# Patient Record
Sex: Female | Born: 1950
Health system: Southern US, Community
[De-identification: ages and names within clinical notes are randomized; demographics above are authoritative.]

## PROBLEM LIST (undated history)

## (undated) DIAGNOSIS — M549 Dorsalgia, unspecified: Secondary | ICD-10-CM

## (undated) DIAGNOSIS — H547 Unspecified visual loss: Secondary | ICD-10-CM

## (undated) DIAGNOSIS — R079 Chest pain, unspecified: Secondary | ICD-10-CM

## (undated) DIAGNOSIS — K219 Gastro-esophageal reflux disease without esophagitis: Secondary | ICD-10-CM

## (undated) DIAGNOSIS — F419 Anxiety disorder, unspecified: Secondary | ICD-10-CM

## (undated) DIAGNOSIS — R42 Dizziness and giddiness: Secondary | ICD-10-CM

## (undated) DIAGNOSIS — G4730 Sleep apnea, unspecified: Secondary | ICD-10-CM

## (undated) DIAGNOSIS — F32A Depression, unspecified: Secondary | ICD-10-CM

## (undated) DIAGNOSIS — I10 Essential (primary) hypertension: Secondary | ICD-10-CM

## (undated) DIAGNOSIS — M51369 Other intervertebral disc degeneration, lumbar region without mention of lumbar back pain or lower extremity pain: Secondary | ICD-10-CM

## (undated) DIAGNOSIS — Z87442 Personal history of urinary calculi: Secondary | ICD-10-CM

## (undated) DIAGNOSIS — F329 Major depressive disorder, single episode, unspecified: Secondary | ICD-10-CM

## (undated) DIAGNOSIS — M5136 Other intervertebral disc degeneration, lumbar region: Secondary | ICD-10-CM

## (undated) DIAGNOSIS — M5126 Other intervertebral disc displacement, lumbar region: Secondary | ICD-10-CM

## (undated) DIAGNOSIS — I8393 Asymptomatic varicose veins of bilateral lower extremities: Secondary | ICD-10-CM

## (undated) DIAGNOSIS — G43909 Migraine, unspecified, not intractable, without status migrainosus: Secondary | ICD-10-CM

## (undated) HISTORY — DX: Other intervertebral disc degeneration, lumbar region: M51.36

## (undated) HISTORY — PX: BREAST CYST ASPIRATION: SHX578

## (undated) HISTORY — PX: STENT PLACE LEFT URETER (ARMC HX): HXRAD1254

## (undated) HISTORY — DX: Chest pain, unspecified: R07.9

## (undated) HISTORY — DX: Unspecified visual loss: H54.7

## (undated) HISTORY — PX: OTHER SURGICAL HISTORY: SHX169

## (undated) HISTORY — DX: Other intervertebral disc displacement, lumbar region: M51.26

## (undated) HISTORY — DX: Migraine, unspecified, not intractable, without status migrainosus: G43.909

## (undated) HISTORY — DX: Anxiety disorder, unspecified: F41.9

## (undated) HISTORY — DX: Depression, unspecified: F32.A

## (undated) HISTORY — DX: Dorsalgia, unspecified: M54.9

## (undated) HISTORY — PX: TONSILLECTOMY: SUR1361

## (undated) HISTORY — DX: Sleep apnea, unspecified: G47.30

## (undated) HISTORY — DX: Asymptomatic varicose veins of bilateral lower extremities: I83.93

## (undated) HISTORY — DX: Dizziness and giddiness: R42

## (undated) HISTORY — DX: Major depressive disorder, single episode, unspecified: F32.9

## (undated) HISTORY — DX: Other intervertebral disc degeneration, lumbar region without mention of lumbar back pain or lower extremity pain: M51.369

---

## 2004-02-07 ENCOUNTER — Ambulatory Visit: Payer: Self-pay | Admitting: Urology

## 2005-02-22 ENCOUNTER — Ambulatory Visit: Payer: Self-pay | Admitting: Urology

## 2005-03-02 ENCOUNTER — Ambulatory Visit: Payer: Self-pay | Admitting: Internal Medicine

## 2006-04-12 ENCOUNTER — Ambulatory Visit: Payer: Self-pay | Admitting: Internal Medicine

## 2006-11-18 ENCOUNTER — Ambulatory Visit: Payer: Self-pay | Admitting: Unknown Physician Specialty

## 2007-04-17 ENCOUNTER — Ambulatory Visit: Payer: Self-pay | Admitting: Internal Medicine

## 2008-06-15 ENCOUNTER — Ambulatory Visit: Payer: Self-pay | Admitting: Internal Medicine

## 2009-04-30 ENCOUNTER — Emergency Department: Payer: Self-pay | Admitting: Emergency Medicine

## 2009-08-30 ENCOUNTER — Ambulatory Visit: Payer: Self-pay | Admitting: Internal Medicine

## 2010-09-06 ENCOUNTER — Ambulatory Visit: Payer: Self-pay | Admitting: Internal Medicine

## 2011-09-12 ENCOUNTER — Ambulatory Visit: Payer: Self-pay

## 2012-11-05 ENCOUNTER — Ambulatory Visit: Payer: Self-pay | Admitting: Internal Medicine

## 2014-03-09 ENCOUNTER — Ambulatory Visit: Payer: Self-pay

## 2014-08-25 ENCOUNTER — Other Ambulatory Visit
Admission: RE | Admit: 2014-08-25 | Discharge: 2014-08-25 | Disposition: A | Discharge status: Home / Self Care | Payer: PRIVATE HEALTH INSURANCE | Source: Ambulatory Visit | Attending: Nurse Practitioner | Admitting: Nurse Practitioner

## 2014-08-25 DIAGNOSIS — Z0000 Encounter for general adult medical examination without abnormal findings: Secondary | ICD-10-CM | POA: Insufficient documentation

## 2014-08-25 DIAGNOSIS — I10 Essential (primary) hypertension: Secondary | ICD-10-CM | POA: Insufficient documentation

## 2014-08-25 DIAGNOSIS — E780 Pure hypercholesterolemia: Secondary | ICD-10-CM | POA: Insufficient documentation

## 2014-08-25 DIAGNOSIS — E559 Vitamin D deficiency, unspecified: Secondary | ICD-10-CM | POA: Insufficient documentation

## 2014-08-25 LAB — CBC
HCT: 41.9 % (ref 35.0–47.0)
Hemoglobin: 13.9 g/dL (ref 12.0–16.0)
MCH: 29.9 pg (ref 26.0–34.0)
MCHC: 33.3 g/dL (ref 32.0–36.0)
MCV: 89.8 fL (ref 80.0–100.0)
Platelets: 198 10*3/uL (ref 150–440)
RBC: 4.66 MIL/uL (ref 3.80–5.20)
RDW: 14.3 % (ref 11.5–14.5)
WBC: 6.1 10*3/uL (ref 3.6–11.0)

## 2014-08-25 LAB — COMPREHENSIVE METABOLIC PANEL
ALT: 23 U/L (ref 14–54)
ANION GAP: 8 (ref 5–15)
AST: 35 U/L (ref 15–41)
Albumin: 4.4 g/dL (ref 3.5–5.0)
Alkaline Phosphatase: 70 U/L (ref 38–126)
BILIRUBIN TOTAL: 0.4 mg/dL (ref 0.3–1.2)
BUN: 23 mg/dL — AB (ref 6–20)
CO2: 28 mmol/L (ref 22–32)
CREATININE: 0.77 mg/dL (ref 0.44–1.00)
Calcium: 9.5 mg/dL (ref 8.9–10.3)
Chloride: 105 mmol/L (ref 101–111)
GFR calc Af Amer: >60 mL/min (ref >60)
GFR calc non Af Amer: >60 mL/min (ref >60)
Glucose, Bld: 105 mg/dL — ABNORMAL HIGH (ref 65–99)
Potassium: 4.5 mmol/L (ref 3.5–5.1)
Sodium: 141 mmol/L (ref 135–145)
Total Protein: 6.8 g/dL (ref 6.5–8.1)

## 2014-08-25 LAB — LIPID PANEL
Cholesterol: 194 mg/dL (ref 0–200)
HDL: 76 mg/dL (ref >40)
LDL CALC: 106 mg/dL — AB (ref 0–99)
Total CHOL/HDL Ratio: 2.6 RATIO
Triglycerides: 61 mg/dL (ref <150)
VLDL: 12 mg/dL (ref 0–40)

## 2014-08-25 LAB — TSH: TSH: 1.647 u[IU]/mL (ref 0.350–4.500)

## 2014-08-26 LAB — VITAMIN D 25 HYDROXY (VIT D DEFICIENCY, FRACTURES): VIT D 25 HYDROXY: 27.2 ng/mL — AB (ref 30.0–100.0)

## 2014-08-26 LAB — T3: T3, Total: 122 ng/dL (ref 71–180)

## 2014-08-26 LAB — T4: T4, Total: 7.6 ug/dL (ref 4.5–12.0)

## 2015-01-31 ENCOUNTER — Other Ambulatory Visit: Payer: Self-pay | Admitting: Nurse Practitioner

## 2015-01-31 DIAGNOSIS — Z1231 Encounter for screening mammogram for malignant neoplasm of breast: Secondary | ICD-10-CM

## 2015-03-14 ENCOUNTER — Ambulatory Visit: Payer: PRIVATE HEALTH INSURANCE | Attending: Nurse Practitioner

## 2015-10-26 DIAGNOSIS — Z800 Family history of malignant neoplasm of digestive organs: Secondary | ICD-10-CM | POA: Insufficient documentation

## 2015-10-26 DIAGNOSIS — R12 Heartburn: Secondary | ICD-10-CM | POA: Insufficient documentation

## 2016-01-02 DIAGNOSIS — S92909A Unspecified fracture of unspecified foot, initial encounter for closed fracture: Secondary | ICD-10-CM | POA: Insufficient documentation

## 2016-06-07 DIAGNOSIS — E782 Mixed hyperlipidemia: Secondary | ICD-10-CM | POA: Diagnosis not present

## 2016-06-07 DIAGNOSIS — F411 Generalized anxiety disorder: Secondary | ICD-10-CM | POA: Diagnosis not present

## 2016-06-07 DIAGNOSIS — E663 Overweight: Secondary | ICD-10-CM | POA: Diagnosis not present

## 2016-06-07 DIAGNOSIS — I10 Essential (primary) hypertension: Secondary | ICD-10-CM | POA: Diagnosis not present

## 2016-09-07 DIAGNOSIS — Z0001 Encounter for general adult medical examination with abnormal findings: Secondary | ICD-10-CM | POA: Diagnosis not present

## 2016-09-07 DIAGNOSIS — E782 Mixed hyperlipidemia: Secondary | ICD-10-CM | POA: Diagnosis not present

## 2016-09-07 DIAGNOSIS — I10 Essential (primary) hypertension: Secondary | ICD-10-CM | POA: Diagnosis not present

## 2016-09-07 DIAGNOSIS — E559 Vitamin D deficiency, unspecified: Secondary | ICD-10-CM | POA: Diagnosis not present

## 2016-11-05 DIAGNOSIS — I10 Essential (primary) hypertension: Secondary | ICD-10-CM | POA: Diagnosis not present

## 2016-11-05 DIAGNOSIS — E782 Mixed hyperlipidemia: Secondary | ICD-10-CM | POA: Diagnosis not present

## 2016-11-05 DIAGNOSIS — E663 Overweight: Secondary | ICD-10-CM | POA: Diagnosis not present

## 2016-11-05 DIAGNOSIS — F411 Generalized anxiety disorder: Secondary | ICD-10-CM | POA: Diagnosis not present

## 2017-04-05 ENCOUNTER — Encounter: Payer: Self-pay | Admitting: Nurse Practitioner

## 2017-04-05 ENCOUNTER — Ambulatory Visit: Payer: BLUE CROSS/BLUE SHIELD | Admitting: Nurse Practitioner

## 2017-04-05 VITALS — BP 152/86 | HR 80 | Resp 16 | Ht 63.0 in | Wt 169.8 lb

## 2017-04-05 DIAGNOSIS — D485 Neoplasm of uncertain behavior of skin: Secondary | ICD-10-CM | POA: Insufficient documentation

## 2017-04-05 DIAGNOSIS — R079 Chest pain, unspecified: Secondary | ICD-10-CM | POA: Insufficient documentation

## 2017-04-05 DIAGNOSIS — I341 Nonrheumatic mitral (valve) prolapse: Secondary | ICD-10-CM | POA: Insufficient documentation

## 2017-04-05 DIAGNOSIS — E663 Overweight: Secondary | ICD-10-CM

## 2017-04-05 DIAGNOSIS — I10 Essential (primary) hypertension: Secondary | ICD-10-CM | POA: Diagnosis not present

## 2017-04-05 DIAGNOSIS — I058 Other rheumatic mitral valve diseases: Secondary | ICD-10-CM

## 2017-04-05 DIAGNOSIS — J393 Upper respiratory tract hypersensitivity reaction, site unspecified: Secondary | ICD-10-CM | POA: Insufficient documentation

## 2017-04-05 DIAGNOSIS — E782 Mixed hyperlipidemia: Secondary | ICD-10-CM | POA: Diagnosis not present

## 2017-04-05 DIAGNOSIS — F411 Generalized anxiety disorder: Secondary | ICD-10-CM | POA: Diagnosis not present

## 2017-04-05 DIAGNOSIS — K219 Gastro-esophageal reflux disease without esophagitis: Secondary | ICD-10-CM | POA: Insufficient documentation

## 2017-04-05 DIAGNOSIS — R0602 Shortness of breath: Secondary | ICD-10-CM | POA: Insufficient documentation

## 2017-04-05 DIAGNOSIS — G4700 Insomnia, unspecified: Secondary | ICD-10-CM | POA: Insufficient documentation

## 2017-04-05 DIAGNOSIS — E669 Obesity, unspecified: Secondary | ICD-10-CM | POA: Insufficient documentation

## 2017-04-05 DIAGNOSIS — R300 Dysuria: Secondary | ICD-10-CM | POA: Insufficient documentation

## 2017-04-05 DIAGNOSIS — I059 Rheumatic mitral valve disease, unspecified: Secondary | ICD-10-CM | POA: Insufficient documentation

## 2017-04-05 MED ORDER — ALPRAZOLAM 0.25 MG PO TABS: 0.2500 mg | ORAL_TABLET | ORAL | 3 refills | Status: DC | PRN

## 2017-04-05 NOTE — Patient Instructions (Addendum)

## 2017-04-05 NOTE — Progress Notes (Signed)
Subjective:     Patient ID: Patricia Frazier, female   DOB: [birth year], 66 y.o.   MRN: [id number]  The patient is here for routine follow up visit. She is c/o increased stress related to work and home-life. Sometiimes has trouble sleeping due to increased stress. Blood pressure is doing well. Does feel like she has gained some weight over past few months. Would like to restart appetite suppressant to get a jump start on weight loss in the new year.     Current Outpatient Medications:  .  ALPRAZolam (XANAX) 0.25 MG tablet, Take 1 tablet (0.25 mg total) by mouth as needed for anxiety., Disp: 30 tablet, Rfl: 3 .  amLODipine (NORVASC) 10 MG tablet, Take 10 mg by mouth daily., Disp: , Rfl:  .  phentermine 37.5 MG capsule, Take 37.5 mg by mouth every morning., Disp: , Rfl:  .  pravastatin (PRAVACHOL) 40 MG tablet, Take 40 mg by mouth at bedtime., Disp: , Rfl:   Review of Systems  Constitutional: Positive for fatigue and unexpected weight change.  HENT: Negative.   Respiratory: Negative.  Negative for chest tightness and shortness of breath.   Cardiovascular: Negative for chest pain and palpitations.  Gastrointestinal: Negative.   Endocrine: Negative.   Genitourinary: Negative.   Musculoskeletal: Negative.   Skin: Negative.   Allergic/Immunologic: Negative.   Neurological: Negative.   Hematological: Negative.   Psychiatric/Behavioral: Positive for sleep disturbance. The patient is nervous/anxious.    Today's Vitals   04/05/17 1557 04/05/17 1654  BP: (!) 162/87 (!) 152/86  Pulse: (!) 56 80  Resp: 16   SpO2: 98%   Weight: 169 lb 12.8 oz (77 kg)   Height: 5\' 3"  (1.6 m)       Objective:   Physical Exam  Constitutional: She is oriented to person, place, and time. She appears well-developed and well-nourished.  HENT:  Head: Normocephalic.  Eyes: Pupils are equal, round, and reactive to light.  Neck: Normal range of motion. Neck supple. No JVD present. Carotid bruit is not present. No  thyromegaly present.  Cardiovascular: Normal rate, regular rhythm, normal heart sounds and intact distal pulses.  Pulmonary/Chest: Effort normal and breath sounds normal.  Abdominal: Soft. Bowel sounds are normal. There is no tenderness.  Musculoskeletal: Normal range of motion.  Neurological: She is alert and oriented to person, place, and time.  Skin: Skin is warm and dry.  Psychiatric: She has a normal mood and affect.  Nursing note and vitals reviewed.      Assessment:     Essential (primary) hypertension  Mixed hyperlipidemia  Overweight  GAD (generalized anxiety disorder) - Plan: ALPRAZolam (XANAX) 0.25 MG tablet     Plan:     1. bp stable. Continue bp meds as prescribed  2. Fasting lipid panel ordered. Adjust pravastatin at next visit as indicated 3. Add back phentermine 37.5mg  tablets for next month, possibly 2. Limit calorie intake to 1200 calories per day and participate in regular exercise program.  4. May continue alprazolam 0.25mg  daily as needed new rx provided today.   Follow up 4 weeks.

## 2017-05-24 ENCOUNTER — Encounter: Payer: Self-pay | Admitting: Nurse Practitioner

## 2017-05-24 ENCOUNTER — Ambulatory Visit: Payer: BLUE CROSS/BLUE SHIELD | Admitting: Nurse Practitioner

## 2017-05-24 VITALS — BP 125/84 | HR 67 | Resp 16 | Ht 63.0 in | Wt 168.4 lb

## 2017-05-24 DIAGNOSIS — K219 Gastro-esophageal reflux disease without esophagitis: Secondary | ICD-10-CM

## 2017-05-24 DIAGNOSIS — E663 Overweight: Secondary | ICD-10-CM | POA: Diagnosis not present

## 2017-05-24 DIAGNOSIS — I10 Essential (primary) hypertension: Secondary | ICD-10-CM

## 2017-05-24 DIAGNOSIS — E782 Mixed hyperlipidemia: Secondary | ICD-10-CM

## 2017-05-24 MED ORDER — PRAVASTATIN SODIUM 40 MG PO TABS: 40.0000 mg | ORAL_TABLET | Freq: Every day | ORAL | 5 refills | Status: DC

## 2017-05-24 MED ORDER — PHENTERMINE HCL 37.5 MG PO TABS: 37.5000 mg | ORAL_TABLET | Freq: Every day | ORAL | 1 refills | Status: DC

## 2017-05-24 MED ORDER — OMEPRAZOLE MAGNESIUM 20 MG PO TBEC: 20.0000 mg | DELAYED_RELEASE_TABLET | Freq: Every day | ORAL | 5 refills | Status: DC

## 2017-05-24 MED ORDER — AMLODIPINE BESYLATE 10 MG PO TABS: 10.0000 mg | ORAL_TABLET | Freq: Every day | ORAL | 5 refills | Status: DC

## 2017-05-24 NOTE — Progress Notes (Signed)
[facility] [place], [place] [address]  Internal MEDICINE  Office Visit Note  Patient Name: Patricia Frazier  [id number]  [id number]  Date of Service: 06/02/2017  Chief Complaint  Patient presents with  . Medication Refill    1 pound weight loss. currently takig phentermine. bp has improved with mild weight loss.    The patient is here for routine follow up. Was recently restarted on phentermine to help with weight loss. 1 pound weight loss. currently takig phentermine. bp has improved with mild weight loss. Has been taking weight loss medication off and on and has been off of it for a few weeks. She does need to have refills of her routine medications.    Medication Refill  Pertinent negatives include no abdominal pain, arthralgias, chest pain, chills, congestion, coughing, fatigue, joint swelling, nausea, neck pain, numbness, rash, sore throat or vomiting.    Pt is here for routine follow up.    Current Medication: Outpatient Encounter Medications as of 05/24/2017  Medication Sig  . ALPRAZolam (XANAX) 0.25 MG tablet Take 1 tablet (0.25 mg total) by mouth as needed for anxiety.  Marland Kitchen amLODipine (NORVASC) 10 MG tablet Take 1 tablet (10 mg total) by mouth daily.  Marland Kitchen omeprazole (PRILOSEC OTC) 20 MG tablet Take 1 tablet (20 mg total) by mouth daily.  . pravastatin (PRAVACHOL) 40 MG tablet Take 1 tablet (40 mg total) by mouth at bedtime.  . [DISCONTINUED] amLODipine (NORVASC) 10 MG tablet Take 10 mg by mouth daily.  . [DISCONTINUED] omeprazole (PRILOSEC OTC) 20 MG tablet Take by mouth.  . [DISCONTINUED] Phendimetrazine Tartrate 105 MG CP24 phendimetrazine tartrate ER 105 mg capsule,extended release  TAKE ONE CAPSULE BY MOUTH DAILY FOR WEIGHT LOSS  . [DISCONTINUED] phentermine 37.5 MG capsule Take 37.5 mg by mouth every morning.  . [DISCONTINUED] pravastatin (PRAVACHOL) 40 MG tablet Take 40 mg by mouth at bedtime.  . ondansetron (ZOFRAN-ODT) 4 MG disintegrating  tablet Take by mouth.  . phentermine (ADIPEX-P) 37.5 MG tablet Take 1 tablet (37.5 mg total) by mouth daily before breakfast.  . [DISCONTINUED] celecoxib (CELEBREX) 200 MG capsule Take by mouth.   No facility-administered encounter medications on file as of 05/24/2017.     Surgical History: Past Surgical History:  Procedure Laterality Date  . TONSILLECTOMY    . tubiligation      Medical History: Past Medical History:  Diagnosis Date  . Anxiety   . Back pain   . Chest pain   . Depression   . Migraine   . Sleep apnea   . Varicose veins of legs   . Vision problems     Family History: Family History  Problem Relation Age of Onset  . Hypertension Mother   . Cancer Father   . Hypertension Father   . Hyperlipidemia Father     Social History   Socioeconomic History  . Marital status: Married    Spouse name: Not on file  . Number of children: Not on file  . Years of education: Not on file  . Highest education level: Not on file  Social Needs  . Financial resource strain: Not on file  . Food insecurity - worry: Not on file  . Food insecurity - inability: Not on file  . Transportation needs - medical: Not on file  . Transportation needs - non-medical: Not on file  Occupational History  . Not on file  Tobacco Use  . Smoking status: Former Smoker    Types: Cigarettes  .  Smokeless tobacco: Never Used  Substance and Sexual Activity  . Alcohol use: Yes  . Drug use: No  . Sexual activity: No  Other Topics Concern  . Not on file  Social History Narrative  . Not on file      Review of Systems  Constitutional: Positive for unexpected weight change. Negative for chills and fatigue.       Weight loss 7 pounds since most recent visit.   HENT: Positive for postnasal drip. Negative for congestion, rhinorrhea, sneezing and sore throat.   Eyes: Negative for redness.  Respiratory: Negative for cough, chest tightness and shortness of breath.   Cardiovascular: Negative for  chest pain and palpitations.  Gastrointestinal: Negative for abdominal pain, constipation, diarrhea, nausea and vomiting.  Genitourinary: Negative for dysuria and frequency.  Musculoskeletal: Negative for arthralgias, back pain, joint swelling and neck pain.  Skin: Negative for rash.  Neurological: Negative.  Negative for tremors and numbness.  Hematological: Negative for adenopathy. Does not bruise/bleed easily.  Psychiatric/Behavioral: Negative for behavioral problems (Depression), sleep disturbance and suicidal ideas. The patient is not nervous/anxious.     Today's Vitals   05/24/17 1633  BP: 125/84  Pulse: 67  Resp: 16  SpO2: 95%  Weight: 168 lb 6.4 oz (76.4 kg)  Height: 5\' 3"  (1.6 m)    Physical Exam  Constitutional: She is oriented to person, place, and time. She appears well-developed and well-nourished. No distress.  HENT:  Head: Normocephalic and atraumatic.  Mouth/Throat: No oropharyngeal exudate.  Eyes: EOM are normal. Pupils are equal, round, and reactive to light.  Neck: Normal range of motion. Neck supple. No JVD present. Carotid bruit is not present. No tracheal deviation present. No thyromegaly present.  Cardiovascular: Normal rate, regular rhythm and normal heart sounds. Exam reveals no gallop and no friction rub.  No murmur heard. Pulmonary/Chest: Effort normal and breath sounds normal. No respiratory distress. She has no wheezes. She has no rales. She exhibits no tenderness.  Abdominal: Soft. Bowel sounds are normal. There is no tenderness.  Musculoskeletal: Normal range of motion.  Lymphadenopathy:    She has no cervical adenopathy.  Neurological: She is alert and oriented to person, place, and time. No cranial nerve deficit.  Skin: Skin is warm and dry. She is not diaphoretic.  Psychiatric: She has a normal mood and affect. Her behavior is normal. Judgment and thought content normal.  Nursing note and vitals reviewed.  Assessment/Plan: 1. Essential  (primary) hypertension Stable. Continue bp medicatin as prescribed.  - amLODipine (NORVASC) 10 MG tablet; Take 1 tablet (10 mg total) by mouth daily.  Dispense: 30 tablet; Refill: 5  2. Overweight Doing well. Continue phentermine. Limit calrie intake to 1200 calories per day. Participate in routine exercise.  - phentermine (ADIPEX-P) 37.5 MG tablet; Take 1 tablet (37.5 mg total) by mouth daily before breakfast.  Dispense: 30 tablet; Refill: 1  3. Mixed hyperlipidemia - pravastatin (PRAVACHOL) 40 MG tablet; Take 1 tablet (40 mg total) by mouth at bedtime.  Dispense: 30 tablet; Refill: 5  4. Gastro-esophageal reflux disease without esophagitis - omeprazole (PRILOSEC OTC) 20 MG tablet; Take 1 tablet (20 mg total) by mouth daily.  Dispense: 30 tablet; Refill: 5  General Counseling: Tania verbalizes understanding of the findings of todays visit and agrees with plan of treatment. I have discussed any further diagnostic evaluation that may be needed or ordered today. We also reviewed her medications today. she has been encouraged to call the office with any questions or concerns  that should arise related to todays visit.   This patient was seen by Leretha Pol, FNP- C in Collaboration with Dr Lavera Guise as a part of collaborative care agreement    Meds ordered this encounter  Medications  . phentermine (ADIPEX-P) 37.5 MG tablet    Sig: Take 1 tablet (37.5 mg total) by mouth daily before breakfast.    Dispense:  30 tablet    Refill:  1    Order Specific Question:   Supervising Provider    Answer:   Lavera Guise [place]  . amLODipine (NORVASC) 10 MG tablet    Sig: Take 1 tablet (10 mg total) by mouth daily.    Dispense:  30 tablet    Refill:  5    Order Specific Question:   Supervising Provider    Answer:   Lavera Guise [[id number]]  . omeprazole (PRILOSEC OTC) 20 MG tablet    Sig: Take 1 tablet (20 mg total) by mouth daily.    Dispense:  30 tablet    Refill:  5    Order Specific  Question:   Supervising Provider    Answer:   Lavera Guise [[id number]]  . pravastatin (PRAVACHOL) 40 MG tablet    Sig: Take 1 tablet (40 mg total) by mouth at bedtime.    Dispense:  30 tablet    Refill:  5    Order Specific Question:   Supervising Provider    Answer:   Lavera Guise [[id number]]    Time spent: 46 Minutes          Dr Lavera Guise Internal medicine

## 2017-06-02 ENCOUNTER — Encounter: Payer: Self-pay | Admitting: Nurse Practitioner

## 2017-06-28 ENCOUNTER — Encounter: Payer: Self-pay | Admitting: Nurse Practitioner

## 2017-06-28 ENCOUNTER — Ambulatory Visit: Payer: BLUE CROSS/BLUE SHIELD | Admitting: Nurse Practitioner

## 2017-06-28 VITALS — BP 140/80 | HR 68 | Resp 16 | Ht 63.0 in | Wt 169.0 lb

## 2017-06-28 DIAGNOSIS — F411 Generalized anxiety disorder: Secondary | ICD-10-CM | POA: Diagnosis not present

## 2017-06-28 DIAGNOSIS — I10 Essential (primary) hypertension: Secondary | ICD-10-CM

## 2017-06-28 DIAGNOSIS — E663 Overweight: Secondary | ICD-10-CM | POA: Diagnosis not present

## 2017-06-28 DIAGNOSIS — K219 Gastro-esophageal reflux disease without esophagitis: Secondary | ICD-10-CM | POA: Diagnosis not present

## 2017-06-28 MED ORDER — OMEPRAZOLE MAGNESIUM 20 MG PO TBEC: 20.0000 mg | DELAYED_RELEASE_TABLET | Freq: Every day | ORAL | 4 refills | Status: DC

## 2017-06-28 MED ORDER — PHENTERMINE HCL 37.5 MG PO TABS: 37.5000 mg | ORAL_TABLET | Freq: Every day | ORAL | 1 refills | Status: DC

## 2017-06-28 NOTE — Progress Notes (Signed)
[facility] [place], [place] [address]  Internal MEDICINE  Office Visit Note  Patient Name: Patricia Frazier  [id number]  [id number]  Date of Service: 07/21/2017  Chief Complaint  Patient presents with  . Hypertension    The patient is here for routine follow up. Currently on phentermine to help with weight loss. Has reduced her calorie intake and is exercising regularly, now. Did not lose weight, however, clothing are fitting looser and she is feeling better with more energy.    Pt is here for routine follow up.    Current Medication: Outpatient Encounter Medications as of 06/28/2017  Medication Sig  . ALPRAZolam (XANAX) 0.25 MG tablet Take 1 tablet (0.25 mg total) by mouth as needed for anxiety.  Marland Kitchen amLODipine (NORVASC) 10 MG tablet Take 1 tablet (10 mg total) by mouth daily.  Marland Kitchen omeprazole (PRILOSEC OTC) 20 MG tablet Take 1 tablet (20 mg total) by mouth daily.  . ondansetron (ZOFRAN-ODT) 4 MG disintegrating tablet Take by mouth.  . phentermine (ADIPEX-P) 37.5 MG tablet Take 1 tablet (37.5 mg total) by mouth daily before breakfast.  . pravastatin (PRAVACHOL) 40 MG tablet Take 1 tablet (40 mg total) by mouth at bedtime.  . [DISCONTINUED] omeprazole (PRILOSEC OTC) 20 MG tablet Take 1 tablet (20 mg total) by mouth daily.  . [DISCONTINUED] phentermine (ADIPEX-P) 37.5 MG tablet Take 1 tablet (37.5 mg total) by mouth daily before breakfast.   No facility-administered encounter medications on file as of 06/28/2017.     Surgical History: Past Surgical History:  Procedure Laterality Date  . TONSILLECTOMY    . tubiligation      Medical History: Past Medical History:  Diagnosis Date  . Anxiety   . Back pain   . Chest pain   . Depression   . Migraine   . Sleep apnea   . Varicose veins of legs   . Vision problems     Family History: Family History  Problem Relation Age of Onset  . Hypertension Mother   . Cancer Father   . Hypertension Father    . Hyperlipidemia Father     Social History   Socioeconomic History  . Marital status: Married    Spouse name: Not on file  . Number of children: Not on file  . Years of education: Not on file  . Highest education level: Not on file  Occupational History  . Not on file  Social Needs  . Financial resource strain: Not on file  . Food insecurity:    Worry: Not on file    Inability: Not on file  . Transportation needs:    Medical: Not on file    Non-medical: Not on file  Tobacco Use  . Smoking status: Former Smoker    Types: Cigarettes  . Smokeless tobacco: Never Used  Substance and Sexual Activity  . Alcohol use: Yes  . Drug use: No  . Sexual activity: Never  Lifestyle  . Physical activity:    Days per week: Not on file    Minutes per session: Not on file  . Stress: Not on file  Relationships  . Social connections:    Talks on phone: Not on file    Gets together: Not on file    Attends religious service: Not on file    Active member of club or organization: Not on file    Attends meetings of clubs or organizations: Not on file    Relationship status: Not on file  .  Intimate partner violence:    Fear of current or ex partner: Not on file    Emotionally abused: Not on file    Physically abused: Not on file    Forced sexual activity: Not on file  Other Topics Concern  . Not on file  Social History Narrative  . Not on file      Review of Systems  Constitutional: Negative for chills, fatigue and unexpected weight change.       No weight loss or weight gain since her last visit.   HENT: Negative for congestion, postnasal drip, rhinorrhea, sneezing and sore throat.   Eyes: Negative.  Negative for redness.  Respiratory: Negative for cough, chest tightness, shortness of breath and wheezing.   Cardiovascular: Negative for chest pain and palpitations.  Gastrointestinal: Negative for abdominal pain, constipation, diarrhea, nausea and vomiting.  Endocrine: Negative for  cold intolerance, heat intolerance, polydipsia and polyuria.  Genitourinary: Negative for dysuria and frequency.  Musculoskeletal: Negative for arthralgias, back pain, joint swelling and neck pain.  Skin: Negative for rash.  Allergic/Immunologic: Negative for environmental allergies.  Neurological: Negative for tremors, numbness and headaches.  Hematological: Negative for adenopathy. Does not bruise/bleed easily.  Psychiatric/Behavioral: Negative for behavioral problems (Depression), sleep disturbance and suicidal ideas. The patient is nervous/anxious.     Vital Signs: BP 140/80   Pulse 68   Resp 16   Ht 5\' 3"  (1.6 m)   Wt 169 lb (76.7 kg)   SpO2 98%   BMI 29.94 kg/m    Physical Exam  Constitutional: She is oriented to person, place, and time. She appears well-developed and well-nourished. No distress.  HENT:  Head: Normocephalic and atraumatic.  Mouth/Throat: No oropharyngeal exudate.  Eyes: Pupils are equal, round, and reactive to light. EOM are normal.  Neck: Normal range of motion. Neck supple. No JVD present. Carotid bruit is not present. No tracheal deviation present. No thyromegaly present.  Cardiovascular: Normal rate, regular rhythm and normal heart sounds. Exam reveals no gallop and no friction rub.  No murmur heard. Pulmonary/Chest: Effort normal and breath sounds normal. No respiratory distress. She has no wheezes. She has no rales. She exhibits no tenderness.  Abdominal: Soft. Bowel sounds are normal. There is no tenderness.  Musculoskeletal: Normal range of motion.  Lymphadenopathy:    She has no cervical adenopathy.  Neurological: She is alert and oriented to person, place, and time. No cranial nerve deficit.  Skin: Skin is warm and dry. She is not diaphoretic.  Psychiatric: She has a normal mood and affect. Her behavior is normal. Judgment and thought content normal.  Nursing note and vitals reviewed.  Assessment/Plan: 1. Essential (primary)  hypertension Stable. Continue bp medication as prescribed.   2. Gastro-esophageal reflux disease without esophagitis - omeprazole (PRILOSEC OTC) 20 MG tablet; Take 1 tablet (20 mg total) by mouth daily.  Dispense: 90 tablet; Refill: 4  3. Overweight Continue with limiting calorie intake to 1200-1500 calories per day. Continue regular exercise.  - phentermine (ADIPEX-P) 37.5 MG tablet; Take 1 tablet (37.5 mg total) by mouth daily before breakfast.  Dispense: 30 tablet; Refill: 1  4. Generalized anxiety disorder May continue to take alprazolam as needed and as prescribed.   General Counseling: fe okubo understanding of the findings of todays visit and agrees with plan of treatment. I have discussed any further diagnostic evaluation that may be needed or ordered today. We also reviewed her medications today. she has been encouraged to call the office with any questions or  concerns that should arise related to todays visit.    There is a liability release in patients' chart. There has been a 10 minute discussion about the side effects including but not limited to elevated blood pressure, anxiety, lack of sleep and dry mouth. Pt understands and will like to start/continue on appetite suppressant at this time. There will be one month RX given at the time of visit with proper follow up. Nova diet plan with restricted calories is given to the pt. Pt understands and agrees with  plan of treatment  This patient was seen by Leretha Pol, FNP- C in Collaboration with Dr Lavera Guise as a part of collaborative care agreement  Meds ordered this encounter  Medications  . phentermine (ADIPEX-P) 37.5 MG tablet    Sig: Take 1 tablet (37.5 mg total) by mouth daily before breakfast.    Dispense:  30 tablet    Refill:  1    Order Specific Question:   Supervising Provider    Answer:   Lavera Guise [place]  . omeprazole (PRILOSEC OTC) 20 MG tablet    Sig: Take 1 tablet (20 mg total) by mouth  daily.    Dispense:  90 tablet    Refill:  4    Please change to 90 day prescription. [email or web address]    Order Specific Question:   Supervising Provider    Answer:   Lavera Guise [[id number]]    Time spent: 38 Minutes     Dr Lavera Guise Internal medicine

## 2017-08-05 ENCOUNTER — Ambulatory Visit: Payer: Self-pay | Admitting: Nurse Practitioner

## 2017-08-09 ENCOUNTER — Ambulatory Visit: Payer: Self-pay | Admitting: Nurse Practitioner

## 2017-08-23 DIAGNOSIS — M5442 Lumbago with sciatica, left side: Secondary | ICD-10-CM | POA: Diagnosis not present

## 2017-08-23 DIAGNOSIS — M47896 Other spondylosis, lumbar region: Secondary | ICD-10-CM | POA: Diagnosis not present

## 2017-09-06 ENCOUNTER — Encounter: Payer: Self-pay | Admitting: Nurse Practitioner

## 2017-09-06 ENCOUNTER — Ambulatory Visit: Payer: BLUE CROSS/BLUE SHIELD | Admitting: Nurse Practitioner

## 2017-09-06 VITALS — BP 130/80 | HR 80 | Resp 16 | Ht 63.5 in | Wt 170.0 lb

## 2017-09-06 DIAGNOSIS — E663 Overweight: Secondary | ICD-10-CM | POA: Diagnosis not present

## 2017-09-06 DIAGNOSIS — E782 Mixed hyperlipidemia: Secondary | ICD-10-CM | POA: Diagnosis not present

## 2017-09-06 DIAGNOSIS — I10 Essential (primary) hypertension: Secondary | ICD-10-CM

## 2017-09-06 MED ORDER — PHENTERMINE HCL 37.5 MG PO TABS: 37.5000 mg | ORAL_TABLET | Freq: Every day | ORAL | 1 refills | Status: DC

## 2017-09-06 NOTE — Progress Notes (Signed)
[facility] [place],  [id number]  Internal MEDICINE  Office Visit Note  Patient Name: Patricia Frazier  [id number]  [id number]  Date of Service: 09/25/2017   Pt is here for routine follow up.    Chief Complaint  Patient presents with  . Weight Loss    follow up  . Hypertension    The patient has had 1 pound weight gain since her last visit. Has been off appetite suppressant for a few months. Has been on vacation and has long hours at work. Recently hurt her back. Believed to be slipped disc. Has had to change exercise routine. Is stretching and using her son's pool so she can exercise.      Current Medication: Outpatient Encounter Medications as of 09/06/2017  Medication Sig  . ALPRAZolam (XANAX) 0.25 MG tablet Take 1 tablet (0.25 mg total) by mouth as needed for anxiety.  Marland Kitchen amLODipine (NORVASC) 10 MG tablet Take 1 tablet (10 mg total) by mouth daily.  Marland Kitchen omeprazole (PRILOSEC OTC) 20 MG tablet Take 1 tablet (20 mg total) by mouth daily.  . ondansetron (ZOFRAN-ODT) 4 MG disintegrating tablet Take by mouth.  . phentermine (ADIPEX-P) 37.5 MG tablet Take 1 tablet (37.5 mg total) by mouth daily before breakfast.  . pravastatin (PRAVACHOL) 40 MG tablet Take 1 tablet (40 mg total) by mouth at bedtime.  . [DISCONTINUED] phentermine (ADIPEX-P) 37.5 MG tablet Take 1 tablet (37.5 mg total) by mouth daily before breakfast.   No facility-administered encounter medications on file as of 09/06/2017.     Surgical History: Past Surgical History:  Procedure Laterality Date  . TONSILLECTOMY    . tubiligation      Medical History: Past Medical History:  Diagnosis Date  . Anxiety   . Back pain   . Chest pain   . Depression   . Migraine   . Sleep apnea   . Varicose veins of legs   . Vision problems     Family History: Family History  Problem Relation Age of Onset  . Hypertension Mother   . Cancer Father   . Hypertension Father   .  Hyperlipidemia Father     Social History   Socioeconomic History  . Marital status: Married    Spouse name: Not on file  . Number of children: Not on file  . Years of education: Not on file  . Highest education level: Not on file  Occupational History  . Not on file  Social Needs  . Financial resource strain: Not on file  . Food insecurity:    Worry: Not on file    Inability: Not on file  . Transportation needs:    Medical: Not on file    Non-medical: Not on file  Tobacco Use  . Smoking status: Former Smoker    Types: Cigarettes  . Smokeless tobacco: Never Used  Substance and Sexual Activity  . Alcohol use: Yes  . Drug use: No  . Sexual activity: Never  Lifestyle  . Physical activity:    Days per week: Not on file    Minutes per session: Not on file  . Stress: Not on file  Relationships  . Social connections:    Talks on phone: Not on file    Gets together: Not on file    Attends religious service: Not on file    Active member of club or organization: Not on file    Attends meetings of clubs or organizations: Not on file  Relationship status: Not on file  . Intimate partner violence:    Fear of current or ex partner: Not on file    Emotionally abused: Not on file    Physically abused: Not on file    Forced sexual activity: Not on file  Other Topics Concern  . Not on file  Social History Narrative  . Not on file      Review of Systems  Constitutional: Negative for chills, fatigue and unexpected weight change.       No weight loss or weight gain since her last visit.   HENT: Negative for congestion, postnasal drip, rhinorrhea, sneezing and sore throat.   Eyes: Negative.  Negative for redness.  Respiratory: Negative for cough, chest tightness, shortness of breath and wheezing.   Cardiovascular: Negative for chest pain and palpitations.  Gastrointestinal: Negative for abdominal pain, constipation, diarrhea, nausea and vomiting.  Endocrine: Negative for  cold intolerance, heat intolerance, polydipsia and polyuria.  Genitourinary: Negative for dysuria and frequency.  Musculoskeletal: Negative for arthralgias, back pain, joint swelling and neck pain.  Skin: Negative for rash.  Allergic/Immunologic: Negative for environmental allergies.  Neurological: Negative for tremors, numbness and headaches.  Hematological: Negative for adenopathy. Does not bruise/bleed easily.  Psychiatric/Behavioral: Negative for behavioral problems (Depression), sleep disturbance and suicidal ideas. The patient is nervous/anxious.     Today's Vitals   09/06/17 1519  BP: 130/80  Pulse: 80  Resp: 16  SpO2: 94%  Weight: 170 lb (77.1 kg)  Height: 5' 3.5" (1.613 m)    Physical Exam  Constitutional: She is oriented to person, place, and time. She appears well-developed and well-nourished. No distress.  HENT:  Head: Normocephalic and atraumatic.  Mouth/Throat: No oropharyngeal exudate.  Eyes: Pupils are equal, round, and reactive to light. EOM are normal.  Neck: Normal range of motion. Neck supple. No JVD present. Carotid bruit is not present. No tracheal deviation present. No thyromegaly present.  Cardiovascular: Normal rate, regular rhythm and normal heart sounds. Exam reveals no gallop and no friction rub.  No murmur heard. Pulmonary/Chest: Effort normal and breath sounds normal. No respiratory distress. She has no wheezes. She has no rales. She exhibits no tenderness.  Abdominal: Soft. Bowel sounds are normal. There is no tenderness.  Musculoskeletal: Normal range of motion.  Lymphadenopathy:    She has no cervical adenopathy.  Neurological: She is alert and oriented to person, place, and time. No cranial nerve deficit.  Skin: Skin is warm and dry. She is not diaphoretic.  Psychiatric: She has a normal mood and affect. Her behavior is normal. Judgment and thought content normal.  Nursing note and vitals reviewed.  Assessment/Plan:  1. Essential (primary)  hypertension Stable. Continue bp medication as prescribed.   2. Mixed hyperlipidemia Pravastatin as prescribed.   3. Overweight - phentermine (ADIPEX-P) 37.5 MG tablet; Take 1 tablet (37.5 mg total) by mouth daily before breakfast.  Dispense: 30 tablet; Refill: 1   General Counseling: Natally verbalizes understanding of the findings of todays visit and agrees with plan of treatment. I have discussed any further diagnostic evaluation that may be needed or ordered today. We also reviewed her medications today. she has been encouraged to call the office with any questions or concerns that should arise related to todays visit.    Counseling:   There is a liability release in patients' chart. There has been a 10 minute discussion about the side effects including but not limited to elevated blood pressure, anxiety, lack of sleep and dry  mouth. Pt understands and will like to start/continue on appetite suppressant at this time. There will be one month RX given at the time of visit with proper follow up. Nova diet plan with restricted calories is given to the pt. Pt understands and agrees with  plan of treatment  This patient was seen by Leretha Pol, FNP- C in Collaboration with Dr Lavera Guise as a part of collaborative care agreement  Meds ordered this encounter  Medications  . phentermine (ADIPEX-P) 37.5 MG tablet    Sig: Take 1 tablet (37.5 mg total) by mouth daily before breakfast.    Dispense:  30 tablet    Refill:  1    Order Specific Question:   Supervising Provider    Answer:   Lavera Guise [[id number]]    Time spent: 59 Minutes          Dr Lavera Guise Internal medicine

## 2017-09-16 ENCOUNTER — Telehealth: Payer: Self-pay | Admitting: Nurse Practitioner

## 2017-09-16 ENCOUNTER — Other Ambulatory Visit: Payer: Self-pay | Admitting: Nurse Practitioner

## 2017-09-16 DIAGNOSIS — R42 Dizziness and giddiness: Secondary | ICD-10-CM

## 2017-09-16 DIAGNOSIS — R112 Nausea with vomiting, unspecified: Secondary | ICD-10-CM

## 2017-09-16 MED ORDER — MECLIZINE HCL 25 MG PO TABS: 25.0000 mg | ORAL_TABLET | Freq: Three times a day (TID) | ORAL | 1 refills | Status: DC | PRN

## 2017-09-16 NOTE — Progress Notes (Signed)
Sent in prescription for meclizine 25mg  which may be taken up to 3 times daily as needed for vertigo/nausea/vomiting.

## 2017-09-16 NOTE — Telephone Encounter (Signed)
Sent in prescription for meclizine 25mg  which may be taken up to 3 times daily as needed for vertigo/nausea/vomiting.

## 2017-09-16 NOTE — Telephone Encounter (Signed)
Informed pt that prescription was sent  °

## 2017-09-18 ENCOUNTER — Ambulatory Visit (INDEPENDENT_AMBULATORY_CARE_PROVIDER_SITE_OTHER): Payer: Self-pay | Admitting: Internal Medicine

## 2017-09-18 ENCOUNTER — Encounter: Payer: Self-pay | Admitting: Internal Medicine

## 2017-09-18 VITALS — BP 160/88 | HR 87 | Temp 97.6°F | Resp 16 | Ht 63.0 in | Wt 166.0 lb

## 2017-09-18 DIAGNOSIS — M542 Cervicalgia: Secondary | ICD-10-CM | POA: Diagnosis not present

## 2017-09-18 DIAGNOSIS — R112 Nausea with vomiting, unspecified: Secondary | ICD-10-CM | POA: Diagnosis not present

## 2017-09-18 DIAGNOSIS — M5416 Radiculopathy, lumbar region: Secondary | ICD-10-CM | POA: Diagnosis not present

## 2017-09-18 DIAGNOSIS — R42 Dizziness and giddiness: Secondary | ICD-10-CM

## 2017-09-18 DIAGNOSIS — I10 Essential (primary) hypertension: Secondary | ICD-10-CM

## 2017-09-18 MED ORDER — ONDANSETRON HCL 4 MG PO TABS: 4.0000 mg | ORAL_TABLET | Freq: Three times a day (TID) | ORAL | 0 refills | Status: DC | PRN

## 2017-09-18 NOTE — Progress Notes (Signed)
[facility] [place], [place] [address]  Internal MEDICINE  Office Visit Note  Patient Name: Patricia Frazier  [id number]  [id number]  Date of Service: 09/23/2017  Chief Complaint  Patient presents with  . Dizziness    meds not helping, vomiting  . Sciatica    left leg, appt later today   Pt is here for sick visit, She has been having problem with dizziness and vomiting, she does take ant-ivert. Pt is scheduled to have MRI of L- spine for back pian/sciatica  Dizziness  This is a recurrent problem. The current episode started in the past 7 days. The problem occurs constantly. The problem has been gradually worsening. Associated symptoms include numbness and vomiting. Pertinent negatives include no abdominal pain, arthralgias, chest pain, chills, coughing, diaphoresis, fatigue, headaches, nausea or neck pain. The symptoms are aggravated by bending. She has tried nothing for the symptoms. The treatment provided no relief.   Bp is elevated today, She thinks its elevated due to pain   Current Medication: Outpatient Encounter Medications as of 09/18/2017  Medication Sig  . ALPRAZolam (XANAX) 0.25 MG tablet Take 1 tablet (0.25 mg total) by mouth as needed for anxiety.  Marland Kitchen amLODipine (NORVASC) 10 MG tablet Take 1 tablet (10 mg total) by mouth daily.  . meclizine (ANTIVERT) 25 MG tablet Take 1 tablet (25 mg total) by mouth 3 (three) times daily as needed for dizziness.  Marland Kitchen omeprazole (PRILOSEC OTC) 20 MG tablet Take 1 tablet (20 mg total) by mouth daily.  . ondansetron (ZOFRAN) 4 MG tablet Take 1 tablet (4 mg total) by mouth every 8 (eight) hours as needed for nausea or vomiting.  . ondansetron (ZOFRAN-ODT) 4 MG disintegrating tablet Take by mouth.  . phentermine (ADIPEX-P) 37.5 MG tablet Take 1 tablet (37.5 mg total) by mouth daily before breakfast.  . pravastatin (PRAVACHOL) 40 MG tablet Take 1 tablet (40 mg total) by mouth at bedtime.   No facility-administered  encounter medications on file as of 09/18/2017.     Surgical History: Past Surgical History:  Procedure Laterality Date  . TONSILLECTOMY    . tubiligation      Medical History: Past Medical History:  Diagnosis Date  . Anxiety   . Back pain   . Chest pain   . Depression   . Migraine   . Sleep apnea   . Varicose veins of legs   . Vision problems     Family History: Family History  Problem Relation Age of Onset  . Hypertension Mother   . Cancer Father   . Hypertension Father   . Hyperlipidemia Father     Social History   Socioeconomic History  . Marital status: Married    Spouse name: Not on file  . Number of children: Not on file  . Years of education: Not on file  . Highest education level: Not on file  Occupational History  . Not on file  Social Needs  . Financial resource strain: Not on file  . Food insecurity:    Worry: Not on file    Inability: Not on file  . Transportation needs:    Medical: Not on file    Non-medical: Not on file  Tobacco Use  . Smoking status: Former Smoker    Types: Cigarettes  . Smokeless tobacco: Never Used  Substance and Sexual Activity  . Alcohol use: Yes  . Drug use: No  . Sexual activity: Never  Lifestyle  . Physical activity:  Days per week: Not on file    Minutes per session: Not on file  . Stress: Not on file  Relationships  . Social connections:    Talks on phone: Not on file    Gets together: Not on file    Attends religious service: Not on file    Active member of club or organization: Not on file    Attends meetings of clubs or organizations: Not on file    Relationship status: Not on file  . Intimate partner violence:    Fear of current or ex partner: Not on file    Emotionally abused: Not on file    Physically abused: Not on file    Forced sexual activity: Not on file  Other Topics Concern  . Not on file  Social History Narrative  . Not on file   Review of Systems  Constitutional: Negative for  chills, diaphoresis and fatigue.  HENT: Negative for ear pain, postnasal drip and sinus pressure.   Eyes: Negative for photophobia, discharge, redness, itching and visual disturbance.  Respiratory: Negative for cough, shortness of breath and wheezing.   Cardiovascular: Negative for chest pain, palpitations and leg swelling.  Gastrointestinal: Positive for vomiting. Negative for abdominal pain, constipation, diarrhea and nausea.  Genitourinary: Negative for dysuria and flank pain.  Musculoskeletal: Negative for arthralgias, back pain, gait problem and neck pain.  Skin: Negative for color change.  Allergic/Immunologic: Negative for environmental allergies and food allergies.  Neurological: Positive for dizziness and numbness. Negative for headaches.  Hematological: Does not bruise/bleed easily.  Psychiatric/Behavioral: Negative for agitation, behavioral problems (depression) and hallucinations.    Vital Signs: BP (!) 160/88 (BP Location: Left Arm, Patient Position: Sitting)   Pulse 87   Temp 97.6 F (36.4 C)   Resp 16   Ht 5\' 3"  (1.6 m)   Wt 166 lb (75.3 kg)   SpO2 97%   BMI 29.41 kg/m   Physical Exam  Constitutional: She is oriented to person, place, and time. She appears well-developed and well-nourished. No distress.  HENT:  Head: Normocephalic and atraumatic.  Mouth/Throat: Oropharynx is clear and moist. No oropharyngeal exudate.  Eyes: Pupils are equal, round, and reactive to light. EOM are normal.  Neck: Neck supple. No JVD present. No tracheal deviation present. No thyromegaly present.  Neck is slightly stiff. ROM is decreased   Cardiovascular: Normal rate, regular rhythm and normal heart sounds. Exam reveals no gallop and no friction rub.  No murmur heard. Pulmonary/Chest: Effort normal. No respiratory distress. She has no wheezes. She has no rales. She exhibits no tenderness.  Abdominal: Soft. Bowel sounds are normal.  Musculoskeletal: Normal range of motion.   Lymphadenopathy:    She has no cervical adenopathy.  Neurological: She is alert and oriented to person, place, and time. No cranial nerve deficit.  Skin: Skin is warm and dry. She is not diaphoretic.  Psychiatric: She has a normal mood and affect. Her behavior is normal. Judgment and thought content normal.   Assessment/Plan: 1. Vertigo - CT Head Wo Contrast; Future, if negative will need to see neuro or ENT  2. Cervicalgia - MR Cervical Spine Wo Contrast; Future  3. Non-intractable vomiting with nausea, unspecified vomiting type - CT Head Wo Contrast; Future, RX of Zofran is given   4. Essential (primary) hypertension - Monitor bp at home   General Counseling: avya flavell understanding of the findings of todays visit and agrees with plan of treatment. I have discussed any further diagnostic evaluation  that may be needed or ordered today. We also reviewed her medications today. she has been encouraged to call the office with any questions or concerns that should arise related to todays visit.   Orders Placed This Encounter  Procedures  . MR Cervical Spine Wo Contrast  . CT Head Wo Contrast    Meds ordered this encounter  Medications  . ondansetron (ZOFRAN) 4 MG tablet    Sig: Take 1 tablet (4 mg total) by mouth every 8 (eight) hours as needed for nausea or vomiting.    Dispense:  20 tablet    Refill:  0    Time spent:20 Minutes   Dr Lavera Guise Internal medicine

## 2017-09-23 ENCOUNTER — Telehealth: Payer: Self-pay

## 2017-09-23 DIAGNOSIS — M5442 Lumbago with sciatica, left side: Secondary | ICD-10-CM | POA: Diagnosis not present

## 2017-09-23 DIAGNOSIS — R42 Dizziness and giddiness: Secondary | ICD-10-CM | POA: Diagnosis not present

## 2017-09-23 DIAGNOSIS — M542 Cervicalgia: Secondary | ICD-10-CM | POA: Diagnosis not present

## 2017-09-23 NOTE — Telephone Encounter (Signed)
Left message advising patient that [facility] added a CT/MRI to order; patient is scheduled for both procedures on 10/09/17 @ 2/3pm at [facility]. Left # for patient to call to see if they have a cancellation sooner than 7/3.tat  * no authorization required per aim ref# donna B. Tat

## 2017-09-30 DIAGNOSIS — R42 Dizziness and giddiness: Secondary | ICD-10-CM | POA: Diagnosis not present

## 2017-10-02 DIAGNOSIS — M5417 Radiculopathy, lumbosacral region: Secondary | ICD-10-CM | POA: Diagnosis not present

## 2017-10-09 ENCOUNTER — Other Ambulatory Visit: Payer: PRIVATE HEALTH INSURANCE

## 2017-10-09 ENCOUNTER — Ambulatory Visit: Payer: PRIVATE HEALTH INSURANCE

## 2017-10-30 DIAGNOSIS — M5417 Radiculopathy, lumbosacral region: Secondary | ICD-10-CM | POA: Diagnosis not present

## 2017-10-31 ENCOUNTER — Encounter: Payer: Self-pay | Admitting: Nurse Practitioner

## 2017-10-31 ENCOUNTER — Ambulatory Visit (INDEPENDENT_AMBULATORY_CARE_PROVIDER_SITE_OTHER): Payer: Self-pay | Admitting: Nurse Practitioner

## 2017-10-31 VITALS — BP 139/79 | HR 86 | Resp 16 | Ht 63.0 in | Wt 166.6 lb

## 2017-10-31 DIAGNOSIS — I10 Essential (primary) hypertension: Secondary | ICD-10-CM

## 2017-10-31 DIAGNOSIS — F411 Generalized anxiety disorder: Secondary | ICD-10-CM

## 2017-10-31 DIAGNOSIS — R42 Dizziness and giddiness: Secondary | ICD-10-CM

## 2017-10-31 DIAGNOSIS — E663 Overweight: Secondary | ICD-10-CM

## 2017-10-31 MED ORDER — ALPRAZOLAM 0.25 MG PO TABS: 0.2500 mg | ORAL_TABLET | ORAL | 3 refills | Status: DC | PRN

## 2017-10-31 MED ORDER — PHENTERMINE HCL 37.5 MG PO TABS: 37.5000 mg | ORAL_TABLET | Freq: Every day | ORAL | 1 refills | Status: DC

## 2017-10-31 NOTE — Progress Notes (Signed)
[facility] [place], [place] [address]  Internal MEDICINE  Office Visit Note  Patient Name: Patricia Frazier  [id number]  [id number]  Date of Service: 10/31/2017  Chief Complaint  Patient presents with  . Hypertension  . Weight Loss    4 pounds since most recent visit.     The patient was recently treated for vertigo. Ended up being seen per ENT and had procedure to out crystals back in place. She was improved immediately and now has complete resolution of symptoms. Has done well with phentermine. Lost 4 pounds since her last visit. Has no chest pain or pressure, or palpitations. Blood pressure is well managed.   Hypertension  This is a chronic problem. The current episode started more than 1 year ago. The problem is unchanged. The problem is controlled. Pertinent negatives include no chest pain, headaches, neck pain, palpitations or shortness of breath. There are no associated agents to hypertension. Risk factors for coronary artery disease include dyslipidemia, family history and post-menopausal state. Past treatments include calcium channel blockers. The current treatment provides moderate improvement. There are no compliance problems.        Current Medication: Outpatient Encounter Medications as of 10/31/2017  Medication Sig  . ALPRAZolam (XANAX) 0.25 MG tablet Take 1 tablet (0.25 mg total) by mouth as needed for anxiety.  Marland Kitchen amLODipine (NORVASC) 10 MG tablet Take 1 tablet (10 mg total) by mouth daily.  . meclizine (ANTIVERT) 25 MG tablet Take 1 tablet (25 mg total) by mouth 3 (three) times daily as needed for dizziness.  Marland Kitchen omeprazole (PRILOSEC OTC) 20 MG tablet Take 1 tablet (20 mg total) by mouth daily.  . ondansetron (ZOFRAN) 4 MG tablet Take 1 tablet (4 mg total) by mouth every 8 (eight) hours as needed for nausea or vomiting.  . phentermine (ADIPEX-P) 37.5 MG tablet Take 1 tablet (37.5 mg total) by mouth daily before breakfast.  . pravastatin  (PRAVACHOL) 40 MG tablet Take 1 tablet (40 mg total) by mouth at bedtime.  . [DISCONTINUED] ALPRAZolam (XANAX) 0.25 MG tablet Take 1 tablet (0.25 mg total) by mouth as needed for anxiety.  . [DISCONTINUED] ondansetron (ZOFRAN-ODT) 4 MG disintegrating tablet Take by mouth.  . [DISCONTINUED] phentermine (ADIPEX-P) 37.5 MG tablet Take 1 tablet (37.5 mg total) by mouth daily before breakfast.   No facility-administered encounter medications on file as of 10/31/2017.     Surgical History: Past Surgical History:  Procedure Laterality Date  . TONSILLECTOMY    . tubiligation      Medical History: Past Medical History:  Diagnosis Date  . Anxiety   . Back pain   . Bulging lumbar disc   . Chest pain   . Depression   . Migraine   . Sleep apnea   . Varicose veins of legs   . Vertigo   . Vision problems     Family History: Family History  Problem Relation Age of Onset  . Hypertension Mother   . Cancer Father   . Hypertension Father   . Hyperlipidemia Father     Social History   Socioeconomic History  . Marital status: Married    Spouse name: Not on file  . Number of children: Not on file  . Years of education: Not on file  . Highest education level: Not on file  Occupational History  . Not on file  Social Needs  . Financial resource strain: Not on file  . Food insecurity:    Worry: Not  on file    Inability: Not on file  . Transportation needs:    Medical: Not on file    Non-medical: Not on file  Tobacco Use  . Smoking status: Former Smoker    Types: Cigarettes  . Smokeless tobacco: Never Used  Substance and Sexual Activity  . Alcohol use: Yes  . Drug use: No  . Sexual activity: Never  Lifestyle  . Physical activity:    Days per week: Not on file    Minutes per session: Not on file  . Stress: Not on file  Relationships  . Social connections:    Talks on phone: Not on file    Gets together: Not on file    Attends religious service: Not on file    Active  member of club or organization: Not on file    Attends meetings of clubs or organizations: Not on file    Relationship status: Not on file  . Intimate partner violence:    Fear of current or ex partner: Not on file    Emotionally abused: Not on file    Physically abused: Not on file    Forced sexual activity: Not on file  Other Topics Concern  . Not on file  Social History Narrative  . Not on file      Review of Systems  Constitutional: Negative for chills, fatigue and unexpected weight change.       Four pound weight loss since her last visit   HENT: Negative for congestion, postnasal drip, rhinorrhea, sneezing and sore throat.   Eyes: Negative.  Negative for redness.  Respiratory: Negative for cough, chest tightness, shortness of breath and wheezing.   Cardiovascular: Negative for chest pain and palpitations.  Gastrointestinal: Negative for abdominal pain, constipation, diarrhea, nausea and vomiting.  Endocrine: Negative for cold intolerance, heat intolerance, polydipsia and polyuria.  Genitourinary: Negative for dysuria and frequency.  Musculoskeletal: Negative for arthralgias, back pain, joint swelling and neck pain.       Left shoulder pain for which she is seeing orthopedic provider. Had cortisone injection yesterday and is currently without pain.   Skin: Negative for rash.  Allergic/Immunologic: Negative for environmental allergies.  Neurological: Negative for tremors, numbness and headaches.       Vertigo, present at her last visit, is resolved.   Hematological: Negative for adenopathy. Does not bruise/bleed easily.  Psychiatric/Behavioral: Negative for behavioral problems (Depression), sleep disturbance and suicidal ideas. The patient is nervous/anxious.     Today's Vitals   10/31/17 0854  BP: 139/79  Pulse: 86  Resp: 16  SpO2: 97%  Weight: 166 lb 9.6 oz (75.6 kg)  Height: 5\' 3"  (1.6 m)    Physical Exam  Constitutional: She is oriented to person, place, and  time. She appears well-developed and well-nourished. No distress.  HENT:  Head: Normocephalic and atraumatic.  Nose: Nose normal.  Mouth/Throat: No oropharyngeal exudate.  Eyes: Pupils are equal, round, and reactive to light. Conjunctivae and EOM are normal.  Neck: Normal range of motion. Neck supple. No JVD present. Carotid bruit is not present. No tracheal deviation present. No thyromegaly present.  Cardiovascular: Normal rate, regular rhythm and normal heart sounds. Exam reveals no gallop and no friction rub.  No murmur heard. Pulmonary/Chest: Effort normal and breath sounds normal. No respiratory distress. She has no wheezes. She has no rales. She exhibits no tenderness.  Abdominal: Soft. Bowel sounds are normal. There is no tenderness.  Musculoskeletal: Normal range of motion.  Lymphadenopathy:  She has no cervical adenopathy.  Neurological: She is alert and oriented to person, place, and time. No cranial nerve deficit.  Skin: Skin is warm and dry. She is not diaphoretic.  Psychiatric: She has a normal mood and affect. Her behavior is normal. Judgment and thought content normal.  Nursing note and vitals reviewed.  Assessment/Plan:  1. Essential (primary) hypertension Stable. Continue bp medication as prescribed   2. Overweight Improving. May continue phentermine daily. Follow low calorie diet and participate in low-impact cardiovascular exercise.  - phentermine (ADIPEX-P) 37.5 MG tablet; Take 1 tablet (37.5 mg total) by mouth daily before breakfast.  Dispense: 30 tablet; Refill: 1  3. GAD (generalized anxiety disorder) Ok to continue alprazolam 0.25mg  at night as needed for acute anxiety. New rx provided today. - ALPRAZolam (XANAX) 0.25 MG tablet; Take 1 tablet (0.25 mg total) by mouth as needed for anxiety.  Dispense: 30 tablet; Refill: 3  4. Vertigo Resolved after visit with ENT.  General Counseling: Patricia Frazier understanding of the findings of todays visit and  agrees with plan of treatment. I have discussed any further diagnostic evaluation that may be needed or ordered today. We also reviewed her medications today. she has been encouraged to call the office with any questions or concerns that should arise related to todays visit.   There is a liability release in patients' chart. There has been a 10 minute discussion about the side effects including but not limited to elevated blood pressure, anxiety, lack of sleep and dry mouth. Pt understands and will like to start/continue on appetite suppressant at this time. There will be one month RX given at the time of visit with proper follow up. Nova diet plan with restricted calories is given to the pt. Pt understands and agrees with  plan of treatment  This patient was seen by Leretha Pol FNP Collaboration with Dr Lavera Guise as a part of collaborative care agreement  Meds ordered this encounter  Medications  . phentermine (ADIPEX-P) 37.5 MG tablet    Sig: Take 1 tablet (37.5 mg total) by mouth daily before breakfast.    Dispense:  30 tablet    Refill:  1    Order Specific Question:   Supervising Provider    Answer:   Lavera Guise [place]  . ALPRAZolam (XANAX) 0.25 MG tablet    Sig: Take 1 tablet (0.25 mg total) by mouth as needed for anxiety.    Dispense:  30 tablet    Refill:  3    Order Specific Question:   Supervising Provider    Answer:   Lavera Guise [[id number]]    Time spent: 20 Minutes      Dr Lavera Guise Internal medicine

## 2017-12-20 ENCOUNTER — Other Ambulatory Visit: Payer: Self-pay | Admitting: Adult Health

## 2017-12-20 ENCOUNTER — Ambulatory Visit: Payer: Self-pay | Admitting: Adult Health

## 2017-12-20 ENCOUNTER — Other Ambulatory Visit: Payer: Self-pay

## 2017-12-20 ENCOUNTER — Encounter: Payer: Self-pay | Admitting: Adult Health

## 2017-12-20 VITALS — BP 108/78 | HR 65 | Resp 16 | Ht 63.0 in | Wt 169.0 lb

## 2017-12-20 DIAGNOSIS — E663 Overweight: Secondary | ICD-10-CM | POA: Diagnosis not present

## 2017-12-20 DIAGNOSIS — I10 Essential (primary) hypertension: Secondary | ICD-10-CM | POA: Diagnosis not present

## 2017-12-20 DIAGNOSIS — F411 Generalized anxiety disorder: Secondary | ICD-10-CM

## 2017-12-20 DIAGNOSIS — E782 Mixed hyperlipidemia: Secondary | ICD-10-CM

## 2017-12-20 DIAGNOSIS — K219 Gastro-esophageal reflux disease without esophagitis: Secondary | ICD-10-CM

## 2017-12-20 MED ORDER — AMLODIPINE BESYLATE 10 MG PO TABS: 10.0000 mg | ORAL_TABLET | Freq: Every day | ORAL | 5 refills | Status: DC

## 2017-12-20 MED ORDER — PRAVASTATIN SODIUM 40 MG PO TABS: 40.0000 mg | ORAL_TABLET | Freq: Every day | ORAL | 5 refills | Status: DC

## 2017-12-20 MED ORDER — PHENTERMINE HCL 37.5 MG PO TABS: 37.5000 mg | ORAL_TABLET | Freq: Every day | ORAL | 0 refills | Status: DC

## 2017-12-20 NOTE — Patient Instructions (Signed)

## 2017-12-20 NOTE — Progress Notes (Signed)
[facility] [place], [place] [address]  Internal MEDICINE  Office Visit Note  Patient Name: Patricia Frazier  [id number]  [id number]  Date of Service: 12/30/2017  Chief Complaint  Patient presents with  . Hypertension  . Medical Management of Chronic Issues    weight management     HPI Pt here for follow up on HTN, anxiety and weight management.  Pt reports she is taking her blood pressure medicine with no difficulty.  Her HTN is stable at this time.  She reports her anxiety is well controlled with one 0.25mg  Xanax a day.  She mostly needs this for her work days.  She recently went on vacation and forgot to take her Phentermine with her, and she gained 3 or her six pounds back in this month. Pt denies chest pain, palpitations, headaches or other side effects. Also, patient had a bulging disc, and received injections in her back.  She has recently been cleared to start walking for exercise again.     Current Medication: Outpatient Encounter Medications as of 12/20/2017  Medication Sig  . ALPRAZolam (XANAX) 0.25 MG tablet Take 1 tablet (0.25 mg total) by mouth as needed for anxiety.  Marland Kitchen omeprazole (PRILOSEC OTC) 20 MG tablet Take 1 tablet (20 mg total) by mouth daily.  . phentermine (ADIPEX-P) 37.5 MG tablet Take 1 tablet (37.5 mg total) by mouth daily before breakfast.  . [DISCONTINUED] amLODipine (NORVASC) 10 MG tablet Take 1 tablet (10 mg total) by mouth daily.  . [DISCONTINUED] phentermine (ADIPEX-P) 37.5 MG tablet Take 1 tablet (37.5 mg total) by mouth daily before breakfast.  . [DISCONTINUED] pravastatin (PRAVACHOL) 40 MG tablet Take 1 tablet (40 mg total) by mouth at bedtime.  . [DISCONTINUED] meclizine (ANTIVERT) 25 MG tablet Take 1 tablet (25 mg total) by mouth 3 (three) times daily as needed for dizziness. (Patient not taking: Reported on 12/20/2017)  . [DISCONTINUED] ondansetron (ZOFRAN) 4 MG tablet Take 1 tablet (4 mg total) by mouth every 8 (eight)  hours as needed for nausea or vomiting. (Patient not taking: Reported on 12/20/2017)   No facility-administered encounter medications on file as of 12/20/2017.     Surgical History: Past Surgical History:  Procedure Laterality Date  . TONSILLECTOMY    . tubiligation      Medical History: Past Medical History:  Diagnosis Date  . Anxiety   . Back pain   . Bulging lumbar disc   . Chest pain   . Depression   . Migraine   . Sleep apnea   . Varicose veins of legs   . Vertigo   . Vision problems     Family History: Family History  Problem Relation Age of Onset  . Hypertension Mother   . Cancer Father   . Hypertension Father   . Hyperlipidemia Father     Social History   Socioeconomic History  . Marital status: Married    Spouse name: Not on file  . Number of children: Not on file  . Years of education: Not on file  . Highest education level: Not on file  Occupational History  . Not on file  Social Needs  . Financial resource strain: Not on file  . Food insecurity:    Worry: Not on file    Inability: Not on file  . Transportation needs:    Medical: Not on file    Non-medical: Not on file  Tobacco Use  . Smoking status: Former Smoker  Types: Cigarettes  . Smokeless tobacco: Never Used  Substance and Sexual Activity  . Alcohol use: Yes  . Drug use: No  . Sexual activity: Never  Lifestyle  . Physical activity:    Days per week: Not on file    Minutes per session: Not on file  . Stress: Not on file  Relationships  . Social connections:    Talks on phone: Not on file    Gets together: Not on file    Attends religious service: Not on file    Active member of club or organization: Not on file    Attends meetings of clubs or organizations: Not on file    Relationship status: Not on file  . Intimate partner violence:    Fear of current or ex partner: Not on file    Emotionally abused: Not on file    Physically abused: Not on file    Forced sexual  activity: Not on file  Other Topics Concern  . Not on file  Social History Narrative  . Not on file    Review of Systems  Constitutional: Negative for chills, fatigue and unexpected weight change.  HENT: Negative for congestion, rhinorrhea, sneezing and sore throat.   Eyes: Negative for photophobia, pain and redness.  Respiratory: Negative for cough, chest tightness and shortness of breath.   Cardiovascular: Negative for chest pain and palpitations.  Gastrointestinal: Negative for abdominal pain, constipation, diarrhea, nausea and vomiting.  Endocrine: Negative.   Genitourinary: Negative for dysuria and frequency.  Musculoskeletal: Negative for arthralgias, back pain, joint swelling and neck pain.  Skin: Negative for rash.  Allergic/Immunologic: Negative.   Neurological: Negative for tremors and numbness.  Hematological: Negative for adenopathy. Does not bruise/bleed easily.  Psychiatric/Behavioral: Negative for behavioral problems and sleep disturbance. The patient is not nervous/anxious.     Vital Signs: BP 108/78   Pulse 65   Resp 16   Ht 5\' 3"  (1.6 m)   Wt 169 lb (76.7 kg)   SpO2 94%   BMI 29.94 kg/m    Physical Exam  Constitutional: She is oriented to person, place, and time. She appears well-developed and well-nourished. No distress.  HENT:  Head: Normocephalic and atraumatic.  Mouth/Throat: Oropharynx is clear and moist. No oropharyngeal exudate.  Eyes: Pupils are equal, round, and reactive to light. EOM are normal.  Neck: Normal range of motion. Neck supple. No JVD present. No tracheal deviation present. No thyromegaly present.  Cardiovascular: Normal rate, regular rhythm and normal heart sounds. Exam reveals no gallop and no friction rub.  No murmur heard. Pulmonary/Chest: Effort normal and breath sounds normal. No respiratory distress. She has no wheezes. She has no rales. She exhibits no tenderness.  Abdominal: Soft. There is no tenderness. There is no  guarding.  Musculoskeletal: Normal range of motion.  Lymphadenopathy:    She has no cervical adenopathy.  Neurological: She is alert and oriented to person, place, and time. No cranial nerve deficit.  Skin: Skin is warm and dry. She is not diaphoretic.  Psychiatric: She has a normal mood and affect. Her behavior is normal. Judgment and thought content normal.  Nursing note and vitals reviewed.  Assessment/Plan:  1. Essential (primary) hypertension Stable, continue current medications.  2. Gastro-esophageal reflux disease without esophagitis Continue omeprazole.   3. GAD (generalized anxiety disorder) Continues 0.25mg  Xanax as needed.   4. Overweight Obesity Counseling: Risk Assessment: An assessment of behavioral risk factors was made today and includes lack of exercise sedentary lifestyle, lack  of portion control and poor dietary habits.  Risk Modification Advice: She was counseled on portion control guidelines. Restricting daily caloric intake to. . The detrimental long term effects of obesity on her health and ongoing poor compliance was also discussed with the patient.  - phentermine (ADIPEX-P) 37.5 MG tablet; Take 1 tablet (37.5 mg total) by mouth daily before breakfast.  Dispense: 30 tablet; Refill:   There is a liability release in patients' chart. There has been a 10 minute discussion about the side effects including but not limited to elevated blood pressure, anxiety, lack of sleep and dry mouth. Pt understands and will like to start/continue on appetite suppressant at this time. There will be one month RX given at the time of visit with proper follow up. Nova diet plan with restricted calories is given to the pt. Pt understands and agrees with  plan of treatment General Counseling: eupha lobb understanding of the findings of todays visit and agrees with plan of treatment. I have discussed any further diagnostic evaluation that may be needed or ordered today. We also  reviewed her medications today. she has been encouraged to call the office with any questions or concerns that should arise related to todays visit.  Meds ordered this encounter  Medications  . phentermine (ADIPEX-P) 37.5 MG tablet    Sig: Take 1 tablet (37.5 mg total) by mouth daily before breakfast.    Dispense:  30 tablet    Refill:  0    Time spent: 25 Minutes   This patient was seen by Orson Gear AGNP-C in Collaboration with Dr Lavera Guise as a part of collaborative care agreement    Dr Lavera Guise Internal medicine

## 2018-01-16 DIAGNOSIS — R42 Dizziness and giddiness: Secondary | ICD-10-CM | POA: Diagnosis not present

## 2018-01-17 DIAGNOSIS — R42 Dizziness and giddiness: Secondary | ICD-10-CM | POA: Diagnosis not present

## 2018-01-21 DIAGNOSIS — R42 Dizziness and giddiness: Secondary | ICD-10-CM | POA: Diagnosis not present

## 2018-01-22 DIAGNOSIS — R42 Dizziness and giddiness: Secondary | ICD-10-CM | POA: Diagnosis not present

## 2018-01-24 DIAGNOSIS — R42 Dizziness and giddiness: Secondary | ICD-10-CM | POA: Diagnosis not present

## 2018-01-31 DIAGNOSIS — R42 Dizziness and giddiness: Secondary | ICD-10-CM | POA: Diagnosis not present

## 2018-02-07 ENCOUNTER — Ambulatory Visit: Payer: Self-pay | Admitting: Adult Health

## 2018-02-14 ENCOUNTER — Ambulatory Visit: Payer: Self-pay | Admitting: Nurse Practitioner

## 2018-02-14 ENCOUNTER — Encounter: Payer: Self-pay | Admitting: Nurse Practitioner

## 2018-02-14 VITALS — BP 163/77 | HR 64 | Resp 16 | Ht 63.5 in | Wt 169.6 lb

## 2018-02-14 DIAGNOSIS — I10 Essential (primary) hypertension: Secondary | ICD-10-CM

## 2018-02-14 DIAGNOSIS — E663 Overweight: Secondary | ICD-10-CM

## 2018-02-14 DIAGNOSIS — Z1239 Encounter for other screening for malignant neoplasm of breast: Secondary | ICD-10-CM | POA: Diagnosis not present

## 2018-02-14 DIAGNOSIS — R42 Dizziness and giddiness: Secondary | ICD-10-CM | POA: Diagnosis not present

## 2018-02-14 MED ORDER — PHENTERMINE HCL 37.5 MG PO TABS: 37.5000 mg | ORAL_TABLET | Freq: Every day | ORAL | 0 refills | Status: DC

## 2018-02-14 NOTE — Progress Notes (Signed)
[facility] [place], [place] [address]  Internal MEDICINE  Office Visit Note  Patient Name: Patricia Frazier  [id number]  [id number]  Date of Service: 02/15/2018  Chief Complaint  Patient presents with  . Medical Management of Chronic Issues    4wk follow up weight management. pt stopped medications    Patient arrives for weight management follow up. Patient restarted her Phentermine prescription a month ago, but was able to take it only for one week. Patient had a fall due to vertigo 3 weeks ago. She saw her ENT doctor and had two weeks crystal realignment procedures. Patient wants to make sure that vertigo is not caused by phentermine. Her most recent vertigo episode happened in July and was not sure if she was taking Phentermine at that time. Patient was on phentermine on and off for the past two years and no vertigo episodes happened during that time until July. Otherwise, patient states that she feels good. Her blood pressure is elevated today, but patient admits she forgot to take her medications last night. She strongly encouraged to take her medications tonight.       Current Medication: Outpatient Encounter Medications as of 02/14/2018  Medication Sig  . ALPRAZolam (XANAX) 0.25 MG tablet Take 1 tablet (0.25 mg total) by mouth as needed for anxiety.  Marland Kitchen amLODipine (NORVASC) 10 MG tablet Take 1 tablet (10 mg total) by mouth daily.  Marland Kitchen omeprazole (PRILOSEC OTC) 20 MG tablet Take 1 tablet (20 mg total) by mouth daily.  Marland Kitchen omeprazole (PRILOSEC) 20 MG capsule TK 1 T PO D  . phentermine (ADIPEX-P) 37.5 MG tablet Take 1 tablet (37.5 mg total) by mouth daily before breakfast.  . pravastatin (PRAVACHOL) 40 MG tablet Take 1 tablet (40 mg total) by mouth at bedtime.  . [DISCONTINUED] phentermine (ADIPEX-P) 37.5 MG tablet Take 1 tablet (37.5 mg total) by mouth daily before breakfast.   No facility-administered encounter medications on file as of 02/14/2018.      Surgical History: Past Surgical History:  Procedure Laterality Date  . TONSILLECTOMY    . tubiligation      Medical History: Past Medical History:  Diagnosis Date  . Anxiety   . Back pain   . Bulging lumbar disc   . Chest pain   . Depression   . Migraine   . Sleep apnea   . Varicose veins of legs   . Vertigo   . Vision problems     Family History: Family History  Problem Relation Age of Onset  . Hypertension Mother   . Cancer Father   . Hypertension Father   . Hyperlipidemia Father     Social History   Socioeconomic History  . Marital status: Married    Spouse name: Not on file  . Number of children: Not on file  . Years of education: Not on file  . Highest education level: Not on file  Occupational History  . Not on file  Social Needs  . Financial resource strain: Not on file  . Food insecurity:    Worry: Not on file    Inability: Not on file  . Transportation needs:    Medical: Not on file    Non-medical: Not on file  Tobacco Use  . Smoking status: Former Smoker    Types: Cigarettes  . Smokeless tobacco: Never Used  Substance and Sexual Activity  . Alcohol use: Yes  . Drug use: No  . Sexual activity: Never  Lifestyle  .  Physical activity:    Days per week: Not on file    Minutes per session: Not on file  . Stress: Not on file  Relationships  . Social connections:    Talks on phone: Not on file    Gets together: Not on file    Attends religious service: Not on file    Active member of club or organization: Not on file    Attends meetings of clubs or organizations: Not on file    Relationship status: Not on file  . Intimate partner violence:    Fear of current or ex partner: Not on file    Emotionally abused: Not on file    Physically abused: Not on file    Forced sexual activity: Not on file  Other Topics Concern  . Not on file  Social History Narrative  . Not on file      Review of Systems  Constitutional: Negative for  activity change, appetite change, diaphoresis, fatigue, fever and unexpected weight change.  HENT: Negative for congestion, ear discharge, ear pain, facial swelling, hearing loss, postnasal drip, rhinorrhea, sinus pressure, sore throat, trouble swallowing and voice change.   Respiratory: Negative for apnea, cough, choking, chest tightness, shortness of breath and wheezing.   Cardiovascular: Negative for chest pain, palpitations and leg swelling.       Blood pressure elevated today but she has not taken her medication for blood pressure today.  Endocrine: Negative for cold intolerance, heat intolerance, polydipsia, polyphagia and polyuria.  Musculoskeletal: Negative for back pain, gait problem and joint swelling.  Skin: Negative for color change, pallor, rash and wound.  Neurological: Positive for dizziness.  Hematological: Negative for adenopathy.  Psychiatric/Behavioral: Negative for agitation, behavioral problems, confusion, decreased concentration and dysphoric mood.    Vital Signs: BP (!) 163/77 (BP Location: Right Arm, Patient Position: Sitting, Cuff Size: Large)   Pulse 64   Resp 16   Ht 5' 3.5" (1.613 m)   Wt 169 lb 9.6 oz (76.9 kg)   SpO2 96%   BMI 29.57 kg/m    Physical Exam  Constitutional: She is oriented to person, place, and time. She appears well-developed and well-nourished.  HENT:  Head: Normocephalic.  Right Ear: External ear normal.  Left Ear: External ear normal.  Nose: Nose normal.  Mouth/Throat: Oropharynx is clear and moist. No oropharyngeal exudate.  Eyes: Pupils are equal, round, and reactive to light. Conjunctivae and EOM are normal. Right eye exhibits no discharge. Left eye exhibits no discharge. No scleral icterus.  Neck: Normal range of motion. Neck supple. No tracheal deviation present. No thyromegaly present.  Cardiovascular: Normal rate, regular rhythm and normal heart sounds. Exam reveals no gallop and no friction rub.  No murmur  heard. Pulmonary/Chest: Effort normal and breath sounds normal. No respiratory distress. She exhibits no tenderness.  Musculoskeletal: She exhibits no edema, tenderness or deformity.  Neurological: She is alert and oriented to person, place, and time. She displays normal reflexes. No cranial nerve deficit or sensory deficit. Coordination normal.  Neurological exam performed, negative for any deficits   Skin: Skin is warm and dry. Capillary refill takes less than 2 seconds.  Psychiatric: She has a normal mood and affect. Her behavior is normal. Judgment and thought content normal.  Nursing note and vitals reviewed.   Assessment/Plan: 1. Essential (primary) hypertension Blood pressure elevated today, however, she has not taken her medications. This was unintentional and she will take her medication as prescribed.   2. Vertigo Improved. Neurological  exam normal. Will monitor. Visits with ENT as needed.   3. Overweight OK to restart phentermine. Recommended she start with 1/2 tablet for several days, then increase to whole tablet as tolerated. Recommended a 1200-1500 calorie diet and incorporation of regular exercise into her daily routine.  - phentermine (ADIPEX-P) 37.5 MG tablet; Take 1 tablet (37.5 mg total) by mouth daily before breakfast.  Dispense: 30 tablet; Refill: 0  4. Screening for breast cancer - MM DIGITAL SCREENING BILATERAL; Future  General Counseling: krystiana fornes understanding of the findings of todays visit and agrees with plan of treatment. I have discussed any further diagnostic evaluation that may be needed or ordered today. We also reviewed her medications today. she has been encouraged to call the office with any questions or concerns that should arise related to todays visit.  Hypertension Counseling:   The following hypertensive lifestyle modification were recommended and discussed:  1. Limiting alcohol intake to less than 1 oz/day of ethanol:(24 oz of beer or  8 oz of wine or 2 oz of 100-proof whiskey). 2. Take baby ASA 81 mg daily. 3. Importance of regular aerobic exercise and losing weight. 4. Reduce dietary saturated fat and cholesterol intake for overall cardiovascular health. 5. Maintaining adequate dietary potassium, calcium, and magnesium intake. 6. Regular monitoring of the blood pressure. 7. Reduce sodium intake to less than 100 mmol/day (less than 2.3 gm of sodium or less than 6 gm of sodium choride)    There is a liability release in patients' chart. There has been a 10 minute discussion about the side effects including but not limited to elevated blood pressure, anxiety, lack of sleep and dry mouth. Pt understands and will like to start/continue on appetite suppressant at this time. There will be one month RX given at the time of visit with proper follow up. Nova diet plan with restricted calories is given to the pt. Pt understands and agrees with  plan of treatment   Orders Placed This Encounter  Procedures  . MM DIGITAL SCREENING BILATERAL    Meds ordered this encounter  Medications  . phentermine (ADIPEX-P) 37.5 MG tablet    Sig: Take 1 tablet (37.5 mg total) by mouth daily before breakfast.    Dispense:  30 tablet    Refill:  0    Order Specific Question:   Supervising Provider    Answer:   Lavera Guise [[id number]]    Time spent: 37 Minutes      Dr Lavera Guise Internal medicine

## 2018-02-15 DIAGNOSIS — Z79899 Other long term (current) drug therapy: Secondary | ICD-10-CM | POA: Insufficient documentation

## 2018-02-15 DIAGNOSIS — Z0001 Encounter for general adult medical examination with abnormal findings: Secondary | ICD-10-CM

## 2018-02-21 ENCOUNTER — Other Ambulatory Visit: Payer: Self-pay | Admitting: Nurse Practitioner

## 2018-02-21 DIAGNOSIS — Z1231 Encounter for screening mammogram for malignant neoplasm of breast: Secondary | ICD-10-CM

## 2018-02-24 ENCOUNTER — Telehealth: Payer: Self-pay | Admitting: Nurse Practitioner

## 2018-02-24 NOTE — Telephone Encounter (Signed)
Prior authorization for phentermine has been denied by insurance.

## 2018-03-25 ENCOUNTER — Ambulatory Visit: Payer: Self-pay | Admitting: Nurse Practitioner

## 2018-03-25 ENCOUNTER — Encounter: Payer: Self-pay | Admitting: Nurse Practitioner

## 2018-03-25 VITALS — BP 121/76 | HR 62 | Resp 16 | Ht 63.0 in | Wt 168.6 lb

## 2018-03-25 DIAGNOSIS — E663 Overweight: Secondary | ICD-10-CM | POA: Diagnosis not present

## 2018-03-25 DIAGNOSIS — E782 Mixed hyperlipidemia: Secondary | ICD-10-CM | POA: Diagnosis not present

## 2018-03-25 DIAGNOSIS — I10 Essential (primary) hypertension: Secondary | ICD-10-CM | POA: Diagnosis not present

## 2018-03-25 NOTE — Progress Notes (Signed)
[facility] [place], [place] [address]  Internal MEDICINE  Office Visit Note  Patient Name: Patricia Frazier  [id number]  [id number]  Date of Service: 03/26/2018  Chief Complaint  Patient presents with  . Medical Management of Chronic Issues    6wk follow up weight management    The patient has had 1 pound weight gain since her last visit. Has been taking appetite suppressant for past few weeks. Has had a great deal of work stress and has not been able to exercise like she wants to. No negative side effects associated with taking appetite suppressants. Blood pressure is well controlled.       Current Medication: Outpatient Encounter Medications as of 03/25/2018  Medication Sig  . ALPRAZolam (XANAX) 0.25 MG tablet Take 1 tablet (0.25 mg total) by mouth as needed for anxiety.  Marland Kitchen amLODipine (NORVASC) 10 MG tablet Take 1 tablet (10 mg total) by mouth daily.  Marland Kitchen omeprazole (PRILOSEC OTC) 20 MG tablet Take 1 tablet (20 mg total) by mouth daily.  Marland Kitchen omeprazole (PRILOSEC) 20 MG capsule TK 1 T PO D  . phentermine (ADIPEX-P) 37.5 MG tablet Take 1 tablet (37.5 mg total) by mouth daily before breakfast.  . pravastatin (PRAVACHOL) 40 MG tablet Take 1 tablet (40 mg total) by mouth at bedtime.   No facility-administered encounter medications on file as of 03/25/2018.     Surgical History: Past Surgical History:  Procedure Laterality Date  . TONSILLECTOMY    . tubiligation      Medical History: Past Medical History:  Diagnosis Date  . Anxiety   . Back pain   . Bulging lumbar disc   . Chest pain   . Depression   . Migraine   . Sleep apnea   . Varicose veins of legs   . Vertigo   . Vision problems     Family History: Family History  Problem Relation Age of Onset  . Hypertension Mother   . Cancer Father   . Hypertension Father   . Hyperlipidemia Father     Social History   Socioeconomic History  . Marital status: Married    Spouse name: Not  on file  . Number of children: Not on file  . Years of education: Not on file  . Highest education level: Not on file  Occupational History  . Not on file  Social Needs  . Financial resource strain: Not on file  . Food insecurity:    Worry: Not on file    Inability: Not on file  . Transportation needs:    Medical: Not on file    Non-medical: Not on file  Tobacco Use  . Smoking status: Former Smoker    Types: Cigarettes  . Smokeless tobacco: Never Used  Substance and Sexual Activity  . Alcohol use: Yes    Comment: socailly  . Drug use: No  . Sexual activity: Never  Lifestyle  . Physical activity:    Days per week: Not on file    Minutes per session: Not on file  . Stress: Not on file  Relationships  . Social connections:    Talks on phone: Not on file    Gets together: Not on file    Attends religious service: Not on file    Active member of club or organization: Not on file    Attends meetings of clubs or organizations: Not on file    Relationship status: Not on file  . Intimate partner violence:  Fear of current or ex partner: Not on file    Emotionally abused: Not on file    Physically abused: Not on file    Forced sexual activity: Not on file  Other Topics Concern  . Not on file  Social History Narrative  . Not on file      Review of Systems  Constitutional: Negative for activity change, chills, fatigue and unexpected weight change.  HENT: Negative for congestion, postnasal drip, rhinorrhea, sneezing and sore throat.   Respiratory: Negative for cough, chest tightness and shortness of breath.   Cardiovascular: Negative for chest pain and palpitations.  Gastrointestinal: Negative for abdominal pain, constipation, diarrhea, nausea and vomiting.  Endocrine: Negative for polydipsia and polyuria.  Musculoskeletal: Negative for arthralgias, back pain, joint swelling and neck pain.  Skin: Negative for rash.  Neurological: Negative for dizziness, tremors,  numbness and headaches.  Hematological: Negative for adenopathy. Does not bruise/bleed easily.  Psychiatric/Behavioral: Negative for behavioral problems (Depression), sleep disturbance and suicidal ideas. The patient is not nervous/anxious.        Intermittent anxiety. Will take alprazolam when needed and as prescribed .    Today's Vitals   03/25/18 1114  BP: 121/76  Pulse: 62  Resp: 16  SpO2: 95%  Weight: 168 lb 9.6 oz (76.5 kg)  Height: 5\' 3"  (1.6 m)    Physical Exam Vitals signs and nursing note reviewed.  Constitutional:      Appearance: Normal appearance. She is well-developed.  HENT:     Head: Normocephalic and atraumatic.     Right Ear: External ear normal.     Left Ear: External ear normal.     Nose: Nose normal.     Mouth/Throat:     Pharynx: No oropharyngeal exudate.  Eyes:     General: No scleral icterus.       Right eye: No discharge.        Left eye: No discharge.     Conjunctiva/sclera: Conjunctivae normal.     Pupils: Pupils are equal, round, and reactive to light.  Neck:     Musculoskeletal: Normal range of motion and neck supple.     Thyroid: No thyromegaly.     Trachea: No tracheal deviation.  Cardiovascular:     Rate and Rhythm: Normal rate and regular rhythm.     Heart sounds: Normal heart sounds. No murmur. No friction rub. No gallop.   Pulmonary:     Effort: Pulmonary effort is normal. No respiratory distress.     Breath sounds: Normal breath sounds.  Chest:     Chest wall: No tenderness.  Musculoskeletal:        General: No tenderness or deformity.  Skin:    General: Skin is warm and dry.     Capillary Refill: Capillary refill takes less than 2 seconds.  Neurological:     General: No focal deficit present.     Mental Status: She is alert and oriented to person, place, and time.     Cranial Nerves: No cranial nerve deficit.     Sensory: No sensory deficit.     Coordination: Coordination normal.     Deep Tendon Reflexes: Reflexes normal.      Comments: Neurological exam performed, negative for any deficits   Psychiatric:        Mood and Affect: Mood normal.        Behavior: Behavior normal.        Thought Content: Thought content normal.  Judgment: Judgment normal.   Assessment/Plan: 1. Essential (primary) hypertension Stable. Continue bp medication as prescribed   2. Mixed hyperlipidemia Continue pravastatin as prescribed   3. Overweight May continue phentermine 37.5mg  daily to help reduce appetite. Limit calorie intake to 1200-1500 calories per day. Gradually incorporate exercise into daily routine.   General Counseling: neya creegan understanding of the findings of todays visit and agrees with plan of treatment. I have discussed any further diagnostic evaluation that may be needed or ordered today. We also reviewed her medications today. she has been encouraged to call the office with any questions or concerns that should arise related to todays visit.   There is a liability release in patients' chart. There has been a 10 minute discussion about the side effects including but not limited to elevated blood pressure, anxiety, lack of sleep and dry mouth. Pt understands and will like to start/continue on appetite suppressant at this time. There will be one month RX given at the time of visit with proper follow up. Nova diet plan with restricted calories is given to the pt. Pt understands and agrees with  plan of treatment  This patient was seen by Leretha Pol FNP Collaboration with Dr Lavera Guise as a part of collaborative care agreement  Time spent: 54 Minutes      Dr Lavera Guise Internal medicine

## 2018-04-07 DIAGNOSIS — N3000 Acute cystitis without hematuria: Secondary | ICD-10-CM | POA: Insufficient documentation

## 2018-04-21 DIAGNOSIS — I10 Essential (primary) hypertension: Secondary | ICD-10-CM | POA: Diagnosis not present

## 2018-04-21 DIAGNOSIS — F411 Generalized anxiety disorder: Secondary | ICD-10-CM | POA: Diagnosis not present

## 2018-04-21 DIAGNOSIS — M5416 Radiculopathy, lumbar region: Secondary | ICD-10-CM | POA: Diagnosis not present

## 2018-05-02 DIAGNOSIS — M5416 Radiculopathy, lumbar region: Secondary | ICD-10-CM | POA: Insufficient documentation

## 2018-05-05 ENCOUNTER — Other Ambulatory Visit: Payer: Self-pay | Admitting: Nurse Practitioner

## 2018-05-05 DIAGNOSIS — F411 Generalized anxiety disorder: Secondary | ICD-10-CM

## 2018-05-05 MED ORDER — ALPRAZOLAM 0.25 MG PO TABS: 0.2500 mg | ORAL_TABLET | ORAL | 3 refills | Status: DC | PRN

## 2018-05-05 NOTE — Progress Notes (Signed)
Approved refills for alprazolam 0.25mg  daily prn anxiety and sent to [facility] in [place] per pharmacy request.

## 2018-05-07 ENCOUNTER — Ambulatory Visit
Admission: RE | Admit: 2018-05-07 | Discharge: 2018-05-07 | Disposition: A | Discharge status: Home / Self Care | Payer: BLUE CROSS/BLUE SHIELD | Source: Ambulatory Visit | Attending: Nurse Practitioner | Admitting: Nurse Practitioner

## 2018-05-07 DIAGNOSIS — Z1231 Encounter for screening mammogram for malignant neoplasm of breast: Secondary | ICD-10-CM | POA: Diagnosis not present

## 2018-05-16 ENCOUNTER — Encounter: Payer: Self-pay | Admitting: Nurse Practitioner

## 2018-05-16 ENCOUNTER — Ambulatory Visit (INDEPENDENT_AMBULATORY_CARE_PROVIDER_SITE_OTHER): Payer: BC Managed Care – PPO | Admitting: Nurse Practitioner

## 2018-05-16 VITALS — BP 134/87 | HR 64 | Resp 16 | Ht 63.5 in | Wt 168.0 lb

## 2018-05-16 DIAGNOSIS — F411 Generalized anxiety disorder: Secondary | ICD-10-CM

## 2018-05-16 DIAGNOSIS — R300 Dysuria: Secondary | ICD-10-CM | POA: Diagnosis not present

## 2018-05-16 DIAGNOSIS — D485 Neoplasm of uncertain behavior of skin: Secondary | ICD-10-CM

## 2018-05-16 DIAGNOSIS — N390 Urinary tract infection, site not specified: Secondary | ICD-10-CM | POA: Diagnosis not present

## 2018-05-16 DIAGNOSIS — I10 Essential (primary) hypertension: Secondary | ICD-10-CM | POA: Diagnosis not present

## 2018-05-16 DIAGNOSIS — Z0001 Encounter for general adult medical examination with abnormal findings: Secondary | ICD-10-CM | POA: Diagnosis not present

## 2018-05-16 DIAGNOSIS — R319 Hematuria, unspecified: Secondary | ICD-10-CM | POA: Diagnosis not present

## 2018-05-16 LAB — POCT URINALYSIS DIPSTICK
BILIRUBIN UA: NEGATIVE
Glucose, UA: NEGATIVE
Ketones, UA: NEGATIVE
NITRITE UA: NEGATIVE
Protein, UA: POSITIVE — AB
Spec Grav, UA: 1.02 (ref 1.010–1.025)
Urobilinogen, UA: 0.2 E.U./dL
pH, UA: 5 (ref 5.0–8.0)

## 2018-05-16 MED ORDER — ALPRAZOLAM 0.25 MG PO TABS: 0.2500 mg | ORAL_TABLET | Freq: Every evening | ORAL | 3 refills | Status: DC | PRN

## 2018-05-16 MED ORDER — SULFAMETHOXAZOLE-TRIMETHOPRIM 800-160 MG PO TABS: 1.0000 | ORAL_TABLET | Freq: Two times a day (BID) | ORAL | 0 refills | Status: DC

## 2018-05-16 NOTE — Progress Notes (Signed)
[facility] [place], [place] [address]  Internal MEDICINE  Office Visit Note  Patient Name: Patricia Frazier  [id number]  [id number]  Date of Service: 05/21/2018   Pt is here for routine health maintenance examination  Chief Complaint  Patient presents with  . Annual Exam  . Depression  . Migraine  . Urinary Urgency     The patient is here for wellness visit. She has noted increased urinary frequency with urgency over past few days. She also has lower back pain, however, she has been suffering with sciatica for months and believes back pain is related to this. Her blood pressure is well managed with current medications .she is not taking appetite suppressant at this time  She has been unable to exercise like she should due to sciatica, so she has discontinued this. She has maintained he weight over past few months.     Current Medication: Outpatient Encounter Medications as of 05/16/2018  Medication Sig  . ALPRAZolam (XANAX) 0.25 MG tablet Take 1 tablet (0.25 mg total) by mouth at bedtime as needed for anxiety.  Marland Kitchen amLODipine (NORVASC) 10 MG tablet Take 1 tablet (10 mg total) by mouth daily.  Marland Kitchen omeprazole (PRILOSEC OTC) 20 MG tablet Take 1 tablet (20 mg total) by mouth daily.  Marland Kitchen omeprazole (PRILOSEC) 20 MG capsule TK 1 T PO D  . phentermine (ADIPEX-P) 37.5 MG tablet Take 1 tablet (37.5 mg total) by mouth daily before breakfast.  . pravastatin (PRAVACHOL) 40 MG tablet Take 1 tablet (40 mg total) by mouth at bedtime.  . sulfamethoxazole-trimethoprim (BACTRIM DS,SEPTRA DS) 800-160 MG tablet Take 1 tablet by mouth 2 (two) times daily.  . [DISCONTINUED] ALPRAZolam (XANAX) 0.25 MG tablet Take 1 tablet (0.25 mg total) by mouth as needed for anxiety.   No facility-administered encounter medications on file as of 05/16/2018.     Surgical History: Past Surgical History:  Procedure Laterality Date  . BREAST CYST ASPIRATION Left yrs ago  . TONSILLECTOMY    .  tubiligation      Medical History: Past Medical History:  Diagnosis Date  . Anxiety   . Back pain   . Bulging lumbar disc   . Chest pain   . Depression   . Migraine   . Sleep apnea   . Varicose veins of legs   . Vertigo   . Vision problems     Family History: Family History  Problem Relation Age of Onset  . Hypertension Mother   . Cancer Father   . Hypertension Father   . Hyperlipidemia Father       Review of Systems  Constitutional: Negative for chills, fatigue and unexpected weight change.  HENT: Negative for congestion, postnasal drip, rhinorrhea, sneezing and sore throat.   Respiratory: Negative for cough, chest tightness, shortness of breath and wheezing.   Cardiovascular: Negative for chest pain and palpitations.  Gastrointestinal: Negative for abdominal pain, constipation, diarrhea, nausea and vomiting.  Endocrine: Negative for cold intolerance, heat intolerance, polydipsia and polyuria.  Genitourinary: Positive for dysuria, flank pain, frequency and urgency.  Musculoskeletal: Positive for back pain. Negative for arthralgias, joint swelling and neck pain.       Seeing orthopedics and will see neurosurgery for his.   Skin: Negative for rash.  Neurological: Negative for dizziness, tremors, numbness and headaches.  Hematological: Negative for adenopathy. Does not bruise/bleed easily.  Psychiatric/Behavioral: Negative for behavioral problems (Depression), sleep disturbance and suicidal ideas. The patient is nervous/anxious.  Today's Vitals   05/16/18 1541  BP: 134/87  Pulse: 64  Resp: 16  SpO2: 94%  Weight: 168 lb (76.2 kg)  Height: 5' 3.5" (1.613 m)   Body mass index is 29.29 kg/m.  Physical Exam Vitals signs and nursing note reviewed.  Constitutional:      General: She is not in acute distress.    Appearance: Normal appearance. She is well-developed. She is not diaphoretic.  HENT:     Head: Normocephalic and atraumatic.     Mouth/Throat:      Pharynx: No oropharyngeal exudate.  Eyes:     Conjunctiva/sclera: Conjunctivae normal.     Pupils: Pupils are equal, round, and reactive to light.  Neck:     Musculoskeletal: Normal range of motion and neck supple.     Thyroid: No thyromegaly.     Vascular: No carotid bruit or JVD.     Trachea: No tracheal deviation.  Cardiovascular:     Rate and Rhythm: Normal rate and regular rhythm.     Pulses: Normal pulses.     Heart sounds: Normal heart sounds. No murmur. No friction rub. No gallop.   Pulmonary:     Effort: Pulmonary effort is normal. No respiratory distress.     Breath sounds: Normal breath sounds. No wheezing or rales.  Chest:     Chest wall: No tenderness.     Breasts:        Right: Normal. No swelling, bleeding, inverted nipple, mass, nipple discharge, skin change or tenderness.        Left: Normal. No swelling, bleeding, inverted nipple, mass, nipple discharge, skin change or tenderness.  Abdominal:     General: Bowel sounds are normal.     Palpations: Abdomen is soft.  Genitourinary:    Comments: U/a positive for protein, small WBC, and trace blood.  Musculoskeletal: Normal range of motion.  Lymphadenopathy:     Cervical: No cervical adenopathy.  Skin:    General: Skin is warm and dry.     Comments: Multiple dark brown lesions on back and abdomen. They are all greater than 13mm in diameter with irregular borders. They are rough and flaky in texture.   Neurological:     General: No focal deficit present.     Mental Status: She is alert and oriented to person, place, and time.     Cranial Nerves: No cranial nerve deficit.  Psychiatric:        Behavior: Behavior normal.        Thought Content: Thought content normal.        Judgment: Judgment normal.      LABS: Recent Results (from the past 2160 hour(s))  CULTURE, URINE COMPREHENSIVE     Status: None   Collection Time: 05/16/18  3:48 PM  Result Value Ref Range   Urine Culture, Comprehensive Final report     Organism ID, Bacteria Comment     Comment: Mixed urogenital flora 10,000-25,000 colony forming units per mL   POCT Urinalysis Dipstick     Status: Abnormal   Collection Time: 05/16/18  3:51 PM  Result Value Ref Range   Color, UA     Clarity, UA     Glucose, UA Negative Negative   Bilirubin, UA Negative    Ketones, UA Negative    Spec Grav, UA 1.020 1.010 - 1.025   Blood, UA trace    pH, UA 5.0 5.0 - 8.0   Protein, UA Positive (A) Negative   Urobilinogen, UA 0.2 0.2  or 1.0 E.U./dL   Nitrite, UA Negative    Leukocytes, UA Small (1+) (A) Negative   Appearance     Odor     Assessment/Plan: 1. Encounter for general adult medical examination with abnormal findings Annual health maintenance  Exam today.  2. Urinary tract infection with hematuria, site unspecified Start bactrim DS bid for 7 days. Send urine for culture and sensitivity and adjust antibiotics as indicated.  - CULTURE, URINE COMPREHENSIVE - sulfamethoxazole-trimethoprim (BACTRIM DS,SEPTRA DS) 800-160 MG tablet; Take 1 tablet by mouth 2 (two) times daily.  Dispense: 14 tablet; Refill: 0  3. Neoplasm of uncertain behavior of skin Refer to dermatology for further evaluation and treatment.  - Ambulatory referral to Dermatology  4. Essential (primary) hypertension Stable. Continue bp medication as prescribed.   5. GAD (generalized anxiety disorder) May continue alprazolam 0.25mg  at bedtime as needed for insomnia related to anxiety. New rx provided today. - ALPRAZolam (XANAX) 0.25 MG tablet; Take 1 tablet (0.25 mg total) by mouth at bedtime as needed for anxiety.  Dispense: 30 tablet; Refill: 3  6. Dysuria - POCT Urinalysis Dipstick  General Counseling: Jovon verbalizes understanding of the findings of todays visit and agrees with plan of treatment. I have discussed any further diagnostic evaluation that may be needed or ordered today. We also reviewed her medications today. she has been encouraged to call the office  with any questions or concerns that should arise related to todays visit.    Counseling:  Hypertension Counseling:   The following hypertensive lifestyle modification were recommended and discussed:  1. Limiting alcohol intake to less than 1 oz/day of ethanol:(24 oz of beer or 8 oz of wine or 2 oz of 100-proof whiskey). 2. Take baby ASA 81 mg daily. 3. Importance of regular aerobic exercise and losing weight. 4. Reduce dietary saturated fat and cholesterol intake for overall cardiovascular health. 5. Maintaining adequate dietary potassium, calcium, and magnesium intake. 6. Regular monitoring of the blood pressure. 7. Reduce sodium intake to less than 100 mmol/day (less than 2.3 gm of sodium or less than 6 gm of sodium choride)   This patient was seen by [place] with Dr Lavera Guise as a part of collaborative care agreement  Orders Placed This Encounter  Procedures  . CULTURE, URINE COMPREHENSIVE  . Ambulatory referral to Dermatology  . POCT Urinalysis Dipstick    Meds ordered this encounter  Medications  . ALPRAZolam (XANAX) 0.25 MG tablet    Sig: Take 1 tablet (0.25 mg total) by mouth at bedtime as needed for anxiety.    Dispense:  30 tablet    Refill:  3    Order Specific Question:   Supervising Provider    Answer:   Lavera Guise [[id number]]  . sulfamethoxazole-trimethoprim (BACTRIM DS,SEPTRA DS) 800-160 MG tablet    Sig: Take 1 tablet by mouth 2 (two) times daily.    Dispense:  14 tablet    Refill:  0    Order Specific Question:   Supervising Provider    Answer:   Lavera Guise [[id number]]    Time spent:30 [place], MD  Internal Medicine

## 2018-05-19 LAB — CULTURE, URINE COMPREHENSIVE

## 2018-05-21 DIAGNOSIS — R319 Hematuria, unspecified: Secondary | ICD-10-CM

## 2018-05-21 DIAGNOSIS — N390 Urinary tract infection, site not specified: Secondary | ICD-10-CM | POA: Insufficient documentation

## 2018-05-22 ENCOUNTER — Telehealth: Payer: Self-pay

## 2018-05-22 NOTE — Telephone Encounter (Signed)
Had reminder patient wants to wait and hold off on referral, called patient 05/22/2018 and unable to reach patient to follow up. Beth

## 2018-05-28 DIAGNOSIS — M5416 Radiculopathy, lumbar region: Secondary | ICD-10-CM | POA: Diagnosis not present

## 2018-06-11 ENCOUNTER — Telehealth: Payer: Self-pay | Admitting: Adult Health

## 2018-06-11 DIAGNOSIS — M5416 Radiculopathy, lumbar region: Secondary | ICD-10-CM | POA: Diagnosis not present

## 2018-06-11 DIAGNOSIS — G8929 Other chronic pain: Secondary | ICD-10-CM | POA: Diagnosis not present

## 2018-06-11 DIAGNOSIS — M5442 Lumbago with sciatica, left side: Secondary | ICD-10-CM | POA: Diagnosis not present

## 2018-06-12 ENCOUNTER — Encounter: Payer: Self-pay | Admitting: Adult Health

## 2018-06-12 ENCOUNTER — Ambulatory Visit: Payer: BC Managed Care – PPO | Admitting: Adult Health

## 2018-06-12 VITALS — BP 132/88 | HR 68 | Temp 98.2°F | Resp 16 | Ht 63.5 in | Wt 153.0 lb

## 2018-06-12 DIAGNOSIS — I10 Essential (primary) hypertension: Secondary | ICD-10-CM

## 2018-06-12 DIAGNOSIS — R300 Dysuria: Secondary | ICD-10-CM

## 2018-06-12 DIAGNOSIS — F411 Generalized anxiety disorder: Secondary | ICD-10-CM | POA: Diagnosis not present

## 2018-06-12 LAB — POCT URINALYSIS DIPSTICK
BILIRUBIN UA: NEGATIVE
Blood, UA: NEGATIVE
Glucose, UA: NEGATIVE
Ketones, UA: NEGATIVE
Leukocytes, UA: NEGATIVE
Nitrite, UA: NEGATIVE
Protein, UA: NEGATIVE
Spec Grav, UA: 1.01 (ref 1.010–1.025)
Urobilinogen, UA: 0.2 E.U./dL
pH, UA: 5 (ref 5.0–8.0)

## 2018-06-12 NOTE — Progress Notes (Signed)
[facility] [place], [place] [address]  Internal MEDICINE  Office Visit Note  Patient Name: Patricia Frazier  [id number]  [id number]  Date of Service: 06/12/2018  Chief Complaint  Patient presents with  . Urinary Tract Infection    having surgery on Wednesday and needs to have urine checked , no symptoms     HPI Pt is here for a sick visit. Pt is here for follow up on UTI. Her urine today is clear.  She is having surgery on her back in a week and wanted clearance before that surgery.  She denies any symptoms or need at this time.     Current Medication:  Outpatient Encounter Medications as of 06/12/2018  Medication Sig  . ALPRAZolam (XANAX) 0.25 MG tablet Take 1 tablet (0.25 mg total) by mouth at bedtime as needed for anxiety.  Marland Kitchen amLODipine (NORVASC) 10 MG tablet Take 1 tablet (10 mg total) by mouth daily.  Marland Kitchen omeprazole (PRILOSEC OTC) 20 MG tablet Take 1 tablet (20 mg total) by mouth daily.  Marland Kitchen omeprazole (PRILOSEC) 20 MG capsule TK 1 T PO D  . phentermine (ADIPEX-P) 37.5 MG tablet Take 1 tablet (37.5 mg total) by mouth daily before breakfast.  . pravastatin (PRAVACHOL) 40 MG tablet Take 1 tablet (40 mg total) by mouth at bedtime.  . sulfamethoxazole-trimethoprim (BACTRIM DS,SEPTRA DS) 800-160 MG tablet Take 1 tablet by mouth 2 (two) times daily. (Patient not taking: Reported on 06/12/2018)   No facility-administered encounter medications on file as of 06/12/2018.       Medical History: Past Medical History:  Diagnosis Date  . Anxiety   . Back pain   . Bulging lumbar disc   . Chest pain   . Depression   . Migraine   . Sleep apnea   . Varicose veins of legs   . Vertigo   . Vision problems      Vital Signs: BP 132/88   Pulse 68   Temp 98.2 F (36.8 C) (Oral)   Resp 16   Ht 5' 3.5" (1.613 m)   Wt 153 lb (69.4 kg)   SpO2 96%   BMI 26.68 kg/m    Review of Systems  Constitutional: Negative for chills, fatigue and unexpected weight  change.  HENT: Negative for congestion, rhinorrhea, sneezing and sore throat.   Eyes: Negative for photophobia, pain and redness.  Respiratory: Negative for cough, chest tightness and shortness of breath.   Cardiovascular: Negative for chest pain and palpitations.  Gastrointestinal: Negative for abdominal pain, constipation, diarrhea, nausea and vomiting.  Endocrine: Negative.   Genitourinary: Negative for dysuria and frequency.  Musculoskeletal: Negative for arthralgias, back pain, joint swelling and neck pain.  Skin: Negative for rash.  Allergic/Immunologic: Negative.   Neurological: Negative for tremors and numbness.  Hematological: Negative for adenopathy. Does not bruise/bleed easily.  Psychiatric/Behavioral: Negative for behavioral problems and sleep disturbance. The patient is not nervous/anxious.     Physical Exam Vitals signs and nursing note reviewed.  Constitutional:      General: She is not in acute distress.    Appearance: She is well-developed. She is not diaphoretic.  HENT:     Head: Normocephalic and atraumatic.     Mouth/Throat:     Pharynx: No oropharyngeal exudate.  Eyes:     Pupils: Pupils are equal, round, and reactive to light.  Neck:     Musculoskeletal: Normal range of motion and neck supple.     Thyroid: No thyromegaly.  Vascular: No JVD.     Trachea: No tracheal deviation.  Cardiovascular:     Rate and Rhythm: Normal rate and regular rhythm.     Heart sounds: Normal heart sounds. No murmur. No friction rub. No gallop.   Pulmonary:     Effort: Pulmonary effort is normal. No respiratory distress.     Breath sounds: Normal breath sounds. No wheezing or rales.  Chest:     Chest wall: No tenderness.  Abdominal:     Palpations: Abdomen is soft.     Tenderness: There is no abdominal tenderness. There is no guarding.  Musculoskeletal: Normal range of motion.  Lymphadenopathy:     Cervical: No cervical adenopathy.  Skin:    General: Skin is warm and  dry.  Neurological:     Mental Status: She is alert and oriented to person, place, and time.     Cranial Nerves: No cranial nerve deficit.  Psychiatric:        Behavior: Behavior normal.        Thought Content: Thought content normal.        Judgment: Judgment normal.    Assessment/Plan: 1. Dysuria Urine is clear today.  She finished her antibiotics course with no difficulty. Denies further issues.  - POCT Urinalysis Dipstick  2. Essential (primary) hypertension Stable, continue current management.   3. GAD (generalized anxiety disorder) Stable, continue current therapy.    General Counseling: Patricia Frazier understanding of the findings of todays visit and agrees with plan of treatment. I have discussed any further diagnostic evaluation that may be needed or ordered today. We also reviewed her medications today. she has been encouraged to call the office with any questions or concerns that should arise related to todays visit.   Orders Placed This Encounter  Procedures  . POCT Urinalysis Dipstick    No orders of the defined types were placed in this encounter.   Time spent: 25 Minutes  This patient was seen by Orson Gear AGNP-C in Collaboration with Dr Lavera Guise as a part of collaborative care agreement.  Kendell Bane AGNP-C Internal Medicine

## 2018-06-12 NOTE — Patient Instructions (Signed)

## 2018-06-16 ENCOUNTER — Encounter
Admission: RE | Admit: 2018-06-16 | Discharge: 2018-06-16 | Disposition: A | Discharge status: Home / Self Care | Payer: BLUE CROSS/BLUE SHIELD | Source: Ambulatory Visit | Attending: Neurosurgery | Admitting: Neurosurgery

## 2018-06-16 ENCOUNTER — Other Ambulatory Visit: Payer: Self-pay

## 2018-06-16 DIAGNOSIS — G8929 Other chronic pain: Secondary | ICD-10-CM | POA: Diagnosis not present

## 2018-06-16 DIAGNOSIS — M5442 Lumbago with sciatica, left side: Secondary | ICD-10-CM | POA: Diagnosis not present

## 2018-06-16 DIAGNOSIS — Z01818 Encounter for other preprocedural examination: Secondary | ICD-10-CM | POA: Diagnosis not present

## 2018-06-16 DIAGNOSIS — I10 Essential (primary) hypertension: Secondary | ICD-10-CM | POA: Insufficient documentation

## 2018-06-16 HISTORY — DX: Personal history of urinary calculi: Z87.442

## 2018-06-16 HISTORY — DX: Essential (primary) hypertension: I10

## 2018-06-16 HISTORY — DX: Gastro-esophageal reflux disease without esophagitis: K21.9

## 2018-06-16 LAB — URINALYSIS, ROUTINE W REFLEX MICROSCOPIC
BILIRUBIN URINE: NEGATIVE
Glucose, UA: NEGATIVE mg/dL
Hgb urine dipstick: NEGATIVE
KETONES UR: NEGATIVE mg/dL
Leukocytes,Ua: NEGATIVE
Nitrite: NEGATIVE
Protein, ur: NEGATIVE mg/dL
Specific Gravity, Urine: 1.01 (ref 1.005–1.030)
pH: 5 (ref 5.0–8.0)

## 2018-06-16 LAB — CBC
HCT: 41.9 % (ref 36.0–46.0)
Hemoglobin: 13.9 g/dL (ref 12.0–15.0)
MCH: 30.8 pg (ref 26.0–34.0)
MCHC: 33.2 g/dL (ref 30.0–36.0)
MCV: 92.7 fL (ref 80.0–100.0)
PLATELETS: 200 10*3/uL (ref 150–400)
RBC: 4.52 MIL/uL (ref 3.87–5.11)
RDW: 14.6 % (ref 11.5–15.5)
WBC: 5.7 10*3/uL (ref 4.0–10.5)
nRBC: 0 % (ref 0.0–0.2)

## 2018-06-16 LAB — BASIC METABOLIC PANEL
Anion gap: 9 (ref 5–15)
BUN: 16 mg/dL (ref 8–23)
CO2: 26 mmol/L (ref 22–32)
Calcium: 9.6 mg/dL (ref 8.9–10.3)
Chloride: 107 mmol/L (ref 98–111)
Creatinine, Ser: 0.59 mg/dL (ref 0.44–1.00)
GFR calc Af Amer: >60 mL/min (ref >60)
GFR calc non Af Amer: >60 mL/min (ref >60)
Glucose, Bld: 93 mg/dL (ref 70–99)
Potassium: 3.8 mmol/L (ref 3.5–5.1)
Sodium: 142 mmol/L (ref 135–145)

## 2018-06-16 LAB — PROTIME-INR
INR: 0.8 (ref 0.8–1.2)
PROTHROMBIN TIME: 11.3 s — AB (ref 11.4–15.2)

## 2018-06-16 LAB — SURGICAL PCR SCREEN
MRSA, PCR: NEGATIVE
Staphylococcus aureus: NEGATIVE

## 2018-06-16 LAB — APTT: aPTT: 25 seconds (ref 24–36)

## 2018-06-16 NOTE — Patient Instructions (Signed)
INSTRUCTIONS FOR SURGERY     Your surgery is scheduled for:  Wednesday, June 18, 2018     To find out your arrival time for the day of surgery,          please call [phone] between 1 pm and 3 pm on : Tuesday, June 17, 2018     When you arrive for surgery, report to the [facility].       Do NOT stop on the first floor to register.    REMEMBER: Instructions that are not followed completely may result in serious medical risk,  up to and including death, or upon the discretion of your surgeon and anesthesiologist,            your surgery may need to be rescheduled.  __X__ 1. Do not eat food after midnight the night before your procedure.                    No gum, candy, lozenger, tic tacs, tums or hard candies.                  ABSOLUTELY NOTHING SOLID IN YOUR MOUTH AFTER MIDNIGHT                    You may drink unlimited clear liquids up to 2 hours before you are scheduled to arrive for surgery.                   Do not drink anything within those 2 hours unless you need to take medicine, then take the                   smallest amount you need.  Clear liquids include:  water, apple juice without pulp,                   any flavor Gatorade, Black coffee, black tea.  Sugar may be added but no dairy/ honey /lemon.                        Broth and jello is not considered a clear liquid.  __x__  2. On the morning of surgery, please brush your teeth with toothpaste and water. You may rinse with                  mouthwash if you wish but DO NOT SWALLOW TOOTHPASTE OR MOUTHWASH  __X___3. NO alcohol for 24 hours before or after surgery.  __x___ 4.  Do NOT smoke or use e-cigarettes for 24 HOURS PRIOR TO SURGERY.                      DO NOT Use any chewable tobacco products for at least 6 hours prior to surgery.  __x___ 5. If you start any new medication after this appointment and prior to surgery, please             Bring it with you on the day of surgery.  ___x__ 6. Notify your doctor if there is any change in your medical condition, such as fever, infection,  vomitting, diarrhea.  __x___ 7.  USE the CHG SOAP as instructed, the night before surgery and the day of surgery.                   Once you have washed with this soap, do NOT use any of the following: Powders, perfumes or lotions.                   Please do not wear make up, hairpins, clips or nail polish. You may  wear deodorant.                   Men may shave their face and neck.  Women need to shave 48 hours prior to surgery.                   DO NOT wear ANY jewelry on the day of surgery. If there are rings that are too tight to remove easily,                     please address this prior to the surgery day. Piercings need to be removed.                                                                     NO METAL ON YOUR BODY.                    Do NOT bring any valuables.                      If you came to Pre-Admit testing then you will not need license, insurance card or credit card.                      If you will be staying overnight, please either leave your things in the car or have your family be                     responsible for these items.                     [facility] IS NOT RESPONSIBLE FOR BELONGINGS OR VALUABLES.  ___X__ 8. DO NOT wear contact lenses on surgery day.  You may not have dentures,                     Hearing aides, contacts or glasses in the operating room. These items can be                    Placed in the Recovery Room to receive immediately after surgery.  __x___ 9. IF YOU ARE SCHEDULED TO GO HOME ON THE SAME DAY, YOU MUST                   Have someone to drive you home and to stay with you  for the first 24 hours.                    Have an arrangement prior to arriving on surgery day.  ___x__ 10. Take the following medications on the morning of surgery with a sip of  water:                               1. XANAX                     2. PRILOSEC                     3.                     4.                 _____ 11.  Follow any instructions provided to you by your surgeon.                        Such as enema, clear liquid bowel prep  __X__  12. STOP ASPIRIN AS OF: TODAY                       THIS INCLUDES BC POWDERS / GOODIES POWDER  __x___ 13. STOP Anti-inflammatories as of: TODAY                      This includes IBUPROFEN / MOTRIN / ADVIL / ALEVE/ NAPROXYN                    YOU MAY TAKE TYLENOL ANY TIME PRIOR TO SURGERY.  _____ 14.  Stop supplements until after surgery.                     This includes: MULTIVITAMINS                 You may continue taking Vitamin B12 / Vitamin D3 but do not take on the morning of surgery.  _____ 15. Bring your CPAP machine into preop with you on the morning of surgery.  ______16.  Stop Metformin 2 full days prior to surgery.  Stop on:                     TAKE 1/2 OF USUAL INSULIN DOSE ON THE EVENING PRIOR TO SURGERY.                     Do NOT take any diabetes medications on surgery day.  ______17.  Continue to take the following medications but do not take on the morning of surgery:   ______18. If staying overnight, please have appropriate shoes to wear to be able to walk around the unit.                   Wear clean and comfortable clothing to the hospital.  TAKE EVENING MEDICATIONS AS USUAL.

## 2018-06-18 ENCOUNTER — Encounter: Payer: Self-pay | Admitting: *Deleted

## 2018-06-18 ENCOUNTER — Encounter
Admission: RE | Disposition: A | Discharge status: Home / Self Care | Payer: Self-pay | Source: Home / Self Care | Attending: Neurosurgery

## 2018-06-18 ENCOUNTER — Other Ambulatory Visit: Payer: Self-pay

## 2018-06-18 ENCOUNTER — Ambulatory Visit
Admission: RE | Admit: 2018-06-18 | Discharge: 2018-06-18 | Disposition: A | Discharge status: Home / Self Care | Payer: BLUE CROSS/BLUE SHIELD | Attending: Neurosurgery | Admitting: Neurosurgery

## 2018-06-18 ENCOUNTER — Ambulatory Visit: Payer: BLUE CROSS/BLUE SHIELD

## 2018-06-18 DIAGNOSIS — F329 Major depressive disorder, single episode, unspecified: Secondary | ICD-10-CM | POA: Insufficient documentation

## 2018-06-18 DIAGNOSIS — F419 Anxiety disorder, unspecified: Secondary | ICD-10-CM | POA: Diagnosis not present

## 2018-06-18 DIAGNOSIS — G4730 Sleep apnea, unspecified: Secondary | ICD-10-CM | POA: Diagnosis not present

## 2018-06-18 DIAGNOSIS — M5116 Intervertebral disc disorders with radiculopathy, lumbar region: Secondary | ICD-10-CM | POA: Insufficient documentation

## 2018-06-18 DIAGNOSIS — K219 Gastro-esophageal reflux disease without esophagitis: Secondary | ICD-10-CM | POA: Insufficient documentation

## 2018-06-18 DIAGNOSIS — M545 Low back pain: Secondary | ICD-10-CM | POA: Diagnosis not present

## 2018-06-18 DIAGNOSIS — Z419 Encounter for procedure for purposes other than remedying health state, unspecified: Secondary | ICD-10-CM

## 2018-06-18 DIAGNOSIS — Z87442 Personal history of urinary calculi: Secondary | ICD-10-CM | POA: Diagnosis not present

## 2018-06-18 DIAGNOSIS — I10 Essential (primary) hypertension: Secondary | ICD-10-CM | POA: Insufficient documentation

## 2018-06-18 DIAGNOSIS — G43909 Migraine, unspecified, not intractable, without status migrainosus: Secondary | ICD-10-CM | POA: Diagnosis not present

## 2018-06-18 DIAGNOSIS — M5416 Radiculopathy, lumbar region: Secondary | ICD-10-CM | POA: Diagnosis not present

## 2018-06-18 HISTORY — PX: LUMBAR LAMINECTOMY/DECOMPRESSION MICRODISCECTOMY: SHX5026

## 2018-06-18 SURGERY — LUMBAR LAMINECTOMY/DECOMPRESSION MICRODISCECTOMY 1 LEVEL
Anesthesia: General | Site: Back | Laterality: Left

## 2018-06-18 SURGERY — Surgical Case
Anesthesia: *Unknown

## 2018-06-18 MED ORDER — HYDROCODONE-ACETAMINOPHEN 7.5-325 MG PO TABS
1.0000 | ORAL_TABLET | Freq: Once | ORAL | Status: DC | PRN
  Filled 2018-06-18: qty 1

## 2018-06-18 MED ORDER — ROCURONIUM BROMIDE 100 MG/10ML IV SOLN
INTRAVENOUS | Status: DC | PRN
  Administered 2018-06-18: 20 mg via INTRAVENOUS
  Administered 2018-06-18: 5 mg via INTRAVENOUS

## 2018-06-18 MED ORDER — LIDOCAINE HCL 4 % MT SOLN
OROMUCOSAL | Status: DC | PRN
  Administered 2018-06-18: 4 mL via TOPICAL

## 2018-06-18 MED ORDER — METHYLPREDNISOLONE ACETATE 40 MG/ML IJ SUSP
INTRAMUSCULAR | Status: DC | PRN
  Administered 2018-06-18: 40 mg

## 2018-06-18 MED ORDER — PHENYLEPHRINE HCL 10 MG/ML IJ SOLN
INTRAMUSCULAR | Status: AC
  Filled 2018-06-18: qty 1

## 2018-06-18 MED ORDER — SODIUM CHLORIDE 0.9 % IV SOLN
INTRAVENOUS | Status: DC | PRN
  Administered 2018-06-18: 40 mL

## 2018-06-18 MED ORDER — OXYCODONE HCL 5 MG PO TABS: 5.0000 mg | ORAL_TABLET | ORAL | 0 refills | Status: DC | PRN

## 2018-06-18 MED ORDER — BUPIVACAINE-EPINEPHRINE (PF) 0.25% -1:200000 IJ SOLN
INTRAMUSCULAR | Status: AC
  Filled 2018-06-18: qty 30

## 2018-06-18 MED ORDER — THROMBIN 5000 UNITS EX SOLR
CUTANEOUS | Status: DC | PRN
  Administered 2018-06-18: 5000 [IU] via TOPICAL

## 2018-06-18 MED ORDER — METHYLPREDNISOLONE ACETATE 40 MG/ML IJ SUSP
INTRAMUSCULAR | Status: AC
  Filled 2018-06-18: qty 1

## 2018-06-18 MED ORDER — DEXAMETHASONE SODIUM PHOSPHATE 10 MG/ML IJ SOLN
INTRAMUSCULAR | Status: DC | PRN
  Administered 2018-06-18: 10 mg via INTRAVENOUS

## 2018-06-18 MED ORDER — FENTANYL CITRATE (PF) 100 MCG/2ML IJ SOLN
INTRAMUSCULAR | Status: DC | PRN
  Administered 2018-06-18 (×2): 50 ug via INTRAVENOUS

## 2018-06-18 MED ORDER — ACETAMINOPHEN 325 MG PO TABS: 325.0000 mg | ORAL_TABLET | ORAL | Status: DC | PRN

## 2018-06-18 MED ORDER — PROMETHAZINE HCL 25 MG/ML IJ SOLN
6.2500 mg | INTRAMUSCULAR | Status: DC | PRN
  Administered 2018-06-18: 6.25 mg via INTRAVENOUS

## 2018-06-18 MED ORDER — CEFAZOLIN SODIUM-DEXTROSE 2-4 GM/100ML-% IV SOLN
2.0000 g | Freq: Once | INTRAVENOUS | Status: AC
  Administered 2018-06-18: 2 g via INTRAVENOUS

## 2018-06-18 MED ORDER — HYDROMORPHONE HCL 1 MG/ML IJ SOLN
INTRAMUSCULAR | Status: AC
  Administered 2018-06-18: 0.25 mg via INTRAVENOUS
  Filled 2018-06-18: qty 1

## 2018-06-18 MED ORDER — ONDANSETRON HCL 4 MG/2ML IJ SOLN
INTRAMUSCULAR | Status: AC
  Filled 2018-06-18: qty 2

## 2018-06-18 MED ORDER — BACITRACIN 50000 UNITS IM SOLR
INTRAMUSCULAR | Status: AC
  Filled 2018-06-18: qty 1

## 2018-06-18 MED ORDER — PROPOFOL 500 MG/50ML IV EMUL
INTRAVENOUS | Status: AC
  Filled 2018-06-18: qty 50

## 2018-06-18 MED ORDER — SODIUM CHLORIDE FLUSH 0.9 % IV SOLN
INTRAVENOUS | Status: AC
  Filled 2018-06-18: qty 10

## 2018-06-18 MED ORDER — SODIUM CHLORIDE (PF) 0.9 % IJ SOLN
INTRAMUSCULAR | Status: AC
  Filled 2018-06-18: qty 50

## 2018-06-18 MED ORDER — LIDOCAINE HCL (CARDIAC) PF 100 MG/5ML IV SOSY
PREFILLED_SYRINGE | INTRAVENOUS | Status: DC | PRN
  Administered 2018-06-18: 50 mg via INTRAVENOUS

## 2018-06-18 MED ORDER — HYDROCODONE-ACETAMINOPHEN 7.5-325 MG PO TABS
ORAL_TABLET | ORAL | Status: AC
  Filled 2018-06-18: qty 1

## 2018-06-18 MED ORDER — EPHEDRINE SULFATE 50 MG/ML IJ SOLN
INTRAMUSCULAR | Status: DC | PRN
  Administered 2018-06-18: 10 mg via INTRAVENOUS
  Administered 2018-06-18 (×2): 5 mg via INTRAVENOUS

## 2018-06-18 MED ORDER — THROMBIN 5000 UNITS EX SOLR
CUTANEOUS | Status: AC
  Filled 2018-06-18: qty 5000

## 2018-06-18 MED ORDER — MEPERIDINE HCL 50 MG/ML IJ SOLN: 6.2500 mg | INTRAMUSCULAR | Status: DC | PRN

## 2018-06-18 MED ORDER — BUPIVACAINE LIPOSOME 1.3 % IJ SUSP
INTRAMUSCULAR | Status: AC
  Filled 2018-06-18: qty 20

## 2018-06-18 MED ORDER — METHOCARBAMOL 500 MG PO TABS: 500.0000 mg | ORAL_TABLET | Freq: Four times a day (QID) | ORAL | 0 refills | Status: DC | PRN

## 2018-06-18 MED ORDER — LACTATED RINGERS IV SOLN
INTRAVENOUS | Status: DC
  Administered 2018-06-18: 07:00:00 via INTRAVENOUS

## 2018-06-18 MED ORDER — HYDROMORPHONE HCL 1 MG/ML IJ SOLN
0.2500 mg | INTRAMUSCULAR | Status: AC | PRN
  Administered 2018-06-18 (×8): 0.25 mg via INTRAVENOUS

## 2018-06-18 MED ORDER — PROMETHAZINE HCL 25 MG/ML IJ SOLN
INTRAMUSCULAR | Status: AC
  Filled 2018-06-18: qty 1

## 2018-06-18 MED ORDER — SUGAMMADEX SODIUM 200 MG/2ML IV SOLN
INTRAVENOUS | Status: AC
  Filled 2018-06-18: qty 2

## 2018-06-18 MED ORDER — SUGAMMADEX SODIUM 200 MG/2ML IV SOLN
INTRAVENOUS | Status: DC | PRN
  Administered 2018-06-18: 200 mg via INTRAVENOUS

## 2018-06-18 MED ORDER — BUPIVACAINE-EPINEPHRINE (PF) 0.5% -1:200000 IJ SOLN
INTRAMUSCULAR | Status: DC | PRN
  Administered 2018-06-18: 10 mL

## 2018-06-18 MED ORDER — PROPOFOL 10 MG/ML IV BOLUS
INTRAVENOUS | Status: DC | PRN
  Administered 2018-06-18: 50 mg via INTRAVENOUS
  Administered 2018-06-18: 120 mg via INTRAVENOUS

## 2018-06-18 MED ORDER — ACETAMINOPHEN 160 MG/5ML PO SOLN
325.0000 mg | ORAL | Status: DC | PRN
  Filled 2018-06-18: qty 20.3

## 2018-06-18 MED ORDER — PHENYLEPHRINE HCL 10 MG/ML IJ SOLN
INTRAMUSCULAR | Status: DC | PRN
  Administered 2018-06-18 (×4): 100 ug via INTRAVENOUS

## 2018-06-18 MED ORDER — KETAMINE HCL 50 MG/ML IJ SOLN
INTRAMUSCULAR | Status: AC
  Filled 2018-06-18: qty 10

## 2018-06-18 MED ORDER — BUPIVACAINE HCL 0.5 % IJ SOLN
INTRAMUSCULAR | Status: DC | PRN
  Administered 2018-06-18: 20 mL

## 2018-06-18 MED ORDER — ONDANSETRON HCL 4 MG/2ML IJ SOLN
INTRAMUSCULAR | Status: DC | PRN
  Administered 2018-06-18: 4 mg via INTRAVENOUS

## 2018-06-18 MED ORDER — SUCCINYLCHOLINE CHLORIDE 20 MG/ML IJ SOLN
INTRAMUSCULAR | Status: DC | PRN
  Administered 2018-06-18: 100 mg via INTRAVENOUS

## 2018-06-18 MED ORDER — CEFAZOLIN SODIUM-DEXTROSE 2-4 GM/100ML-% IV SOLN
INTRAVENOUS | Status: AC
  Filled 2018-06-18: qty 100

## 2018-06-18 MED ORDER — KETAMINE HCL 50 MG/ML IJ SOLN
INTRAMUSCULAR | Status: DC | PRN
  Administered 2018-06-18: 25 mg via INTRAVENOUS
  Administered 2018-06-18: 25 mg via INTRAMUSCULAR

## 2018-06-18 MED ORDER — MIDAZOLAM HCL 2 MG/2ML IJ SOLN
INTRAMUSCULAR | Status: AC
  Filled 2018-06-18: qty 2

## 2018-06-18 MED ORDER — SODIUM CHLORIDE 0.9 % IR SOLN
Status: DC | PRN
  Administered 2018-06-18: 1000 mL

## 2018-06-18 MED ORDER — EPHEDRINE SULFATE 50 MG/ML IJ SOLN
INTRAMUSCULAR | Status: AC
  Filled 2018-06-18: qty 1

## 2018-06-18 MED ORDER — GLYCOPYRROLATE 0.2 MG/ML IJ SOLN
INTRAMUSCULAR | Status: DC | PRN
  Administered 2018-06-18: 0.2 mg via INTRAVENOUS

## 2018-06-18 MED ORDER — DEXAMETHASONE SODIUM PHOSPHATE 10 MG/ML IJ SOLN
INTRAMUSCULAR | Status: AC
  Filled 2018-06-18: qty 1

## 2018-06-18 MED ORDER — FENTANYL CITRATE (PF) 100 MCG/2ML IJ SOLN
INTRAMUSCULAR | Status: AC
  Filled 2018-06-18: qty 2

## 2018-06-18 MED ORDER — BUPIVACAINE HCL (PF) 0.5 % IJ SOLN
INTRAMUSCULAR | Status: AC
  Filled 2018-06-18: qty 30

## 2018-06-18 MED ORDER — MIDAZOLAM HCL 2 MG/2ML IJ SOLN
INTRAMUSCULAR | Status: DC | PRN
  Administered 2018-06-18: 2 mg via INTRAVENOUS

## 2018-06-18 SURGICAL SUPPLY — 58 items
BUR NEURO DRILL SOFT 3.0X3.8M (BURR) ×2 IMPLANT
CANISTER SUCT 1200ML W/VALVE (MISCELLANEOUS) ×4 IMPLANT
CHLORAPREP W/TINT 26 (MISCELLANEOUS) ×4 IMPLANT
CNTNR SPEC 2.5X3XGRAD LEK (MISCELLANEOUS) ×1
CONT SPEC 4OZ STER OR WHT (MISCELLANEOUS) ×1
CONTAINER SPEC 2.5X3XGRAD LEK (MISCELLANEOUS) ×1 IMPLANT
COUNTER NEEDLE 20/40 LG (NEEDLE) ×2 IMPLANT
COVER LIGHT HANDLE STERIS (MISCELLANEOUS) ×4 IMPLANT
COVER WAND RF STERILE (DRAPES) ×2 IMPLANT
CUP MEDICINE 2OZ PLAST GRAD ST (MISCELLANEOUS) ×4 IMPLANT
DERMABOND ADVANCED (GAUZE/BANDAGES/DRESSINGS) ×1
DERMABOND ADVANCED .7 DNX12 (GAUZE/BANDAGES/DRESSINGS) ×1 IMPLANT
DRAPE C-ARM 42X72 X-RAY (DRAPES) ×4 IMPLANT
DRAPE LAPAROTOMY 100X77 ABD (DRAPES) ×2 IMPLANT
DRAPE MICROSCOPE SPINE 48X150 (DRAPES) ×2 IMPLANT
DRAPE POUCH INSTRU U-SHP 10X18 (DRAPES) ×2 IMPLANT
DRAPE SURG 17X11 SM STRL (DRAPES) ×8 IMPLANT
ELECT CAUTERY BLADE TIP 2.5 (TIP) ×2
ELECT EZSTD 165MM 6.5IN (MISCELLANEOUS)
ELECT REM PT RETURN 9FT ADLT (ELECTROSURGICAL) ×2
ELECTRODE CAUTERY BLDE TIP 2.5 (TIP) ×1 IMPLANT
ELECTRODE EZSTD 165MM 6.5IN (MISCELLANEOUS) IMPLANT
ELECTRODE REM PT RTRN 9FT ADLT (ELECTROSURGICAL) ×1 IMPLANT
FRAME EYE SHIELD (PROTECTIVE WEAR) ×4 IMPLANT
GLOVE BIOGEL PI IND STRL 7.0 (GLOVE) ×1 IMPLANT
GLOVE BIOGEL PI INDICATOR 7.0 (GLOVE) ×1
GLOVE SURG SYN 7.0 (GLOVE) ×4 IMPLANT
GLOVE SURG SYN 7.0 PF PI (GLOVE) ×2 IMPLANT
GLOVE SURG SYN 8.5  E (GLOVE) ×3
GLOVE SURG SYN 8.5 E (GLOVE) ×3 IMPLANT
GLOVE SURG SYN 8.5 PF PI (GLOVE) ×3 IMPLANT
GOWN SRG XL LVL 3 NONREINFORCE (GOWNS) ×1 IMPLANT
GOWN STRL NON-REIN TWL XL LVL3 (GOWNS) ×1
GOWN STRL REUS W/TWL MED LVL3 (GOWN DISPOSABLE) ×2 IMPLANT
GRADUATE 1200CC STRL 31836 (MISCELLANEOUS) ×2 IMPLANT
KIT SPINAL PRONEVIEW (KITS) ×2 IMPLANT
KNIFE BAYONET SHORT DISCETOMY (MISCELLANEOUS) IMPLANT
MARKER SKIN DUAL TIP RULER LAB (MISCELLANEOUS) ×2 IMPLANT
NDL SAFETY ECLIPSE 18X1.5 (NEEDLE) ×1 IMPLANT
NEEDLE HYPO 18GX1.5 SHARP (NEEDLE) ×1
NEEDLE HYPO 22GX1.5 SAFETY (NEEDLE) ×2 IMPLANT
NS IRRIG 1000ML POUR BTL (IV SOLUTION) ×2 IMPLANT
PACK LAMINECTOMY NEURO (CUSTOM PROCEDURE TRAY) ×2 IMPLANT
PAD ARMBOARD 7.5X6 YLW CONV (MISCELLANEOUS) ×2 IMPLANT
SPOGE SURGIFLO 8M (HEMOSTASIS) ×1
SPONGE SURGIFLO 8M (HEMOSTASIS) ×1 IMPLANT
SUT DVC VLOC 3-0 CL 6 P-12 (SUTURE) ×2 IMPLANT
SUT VIC AB 0 CT1 27 (SUTURE) ×1
SUT VIC AB 0 CT1 27XCR 8 STRN (SUTURE) ×1 IMPLANT
SUT VIC AB 2-0 CT1 18 (SUTURE) ×2 IMPLANT
SYR 10ML LL (SYRINGE) ×2 IMPLANT
SYR 20CC LL (SYRINGE) ×2 IMPLANT
SYR 30ML LL (SYRINGE) ×4 IMPLANT
SYR 3ML LL SCALE MARK (SYRINGE) ×2 IMPLANT
TOWEL OR 17X26 4PK STRL BLUE (TOWEL DISPOSABLE) ×6 IMPLANT
TUBE MATRX SPINL 18MM 6CM DISP (INSTRUMENTS) ×1
TUBE METRX SPINAL 18X6 DISP (INSTRUMENTS) IMPLANT
TUBING CONNECTING 10 (TUBING) ×2 IMPLANT

## 2018-06-18 NOTE — H&P (Signed)
I have reviewed and confirmed my history and physical from 06/11/2018 with no additions or changes. Plan for L L5-S1 Microdiscectomy.  Risks and benefits reviewed.  Heart sounds normal no MRG. Chest Clear to Auscultation Bilaterally.

## 2018-06-18 NOTE — Anesthesia Preprocedure Evaluation (Addendum)
Anesthesia Evaluation  Patient identified by MRN, date of birth, ID band Patient awake    Reviewed: Allergy & Precautions, H&P , NPO status , reviewed documented beta blocker date and time   Airway Mallampati: II  TM Distance: >3 FB Neck ROM: full    Dental  (+) Teeth Intact   Pulmonary shortness of breath, sleep apnea , former smoker,    Pulmonary exam normal        Cardiovascular hypertension, Normal cardiovascular exam     Neuro/Psych  Headaches, PSYCHIATRIC DISORDERS Anxiety Depression    GI/Hepatic GERD  Medicated and Controlled,  Endo/Other    Renal/GU      Musculoskeletal   Abdominal   Peds  Hematology   Anesthesia Other Findings Past Medical History: No date: Anxiety No date: Back pain No date: Bulging lumbar disc No date: Chest pain No date: Depression No date: GERD (gastroesophageal reflux disease) No date: History of kidney stones No date: Hypertension No date: Migraine No date: Sleep apnea     Comment:  Pt denies OSA, has never used a cpap machine No date: Varicose veins of legs No date: Vertigo No date: Vision problems Past Surgical History: yrs ago: BREAST CYST ASPIRATION; Left No date: STENT PLACE LEFT URETER ([facility] HX)     Comment:  unsure  of side No date: TONSILLECTOMY No date: tubiligation   Reproductive/Obstetrics                           Anesthesia Physical Anesthesia Plan  ASA: II  Anesthesia Plan: General   Post-op Pain Management:    Induction: Intravenous  PONV Risk Score and Plan: 3 and Ondansetron and Midazolam  Airway Management Planned: Oral ETT  Additional Equipment:   Intra-op Plan:   Post-operative Plan: Extubation in OR  Informed Consent: I have reviewed the patients History and Physical, chart, labs and discussed the procedure including the risks, benefits and alternatives for the proposed anesthesia with the patient or authorized  representative who has indicated his/her understanding and acceptance.     Dental Advisory Given  Plan Discussed with: CRNA  Anesthesia Plan Comments:         Anesthesia Quick Evaluation

## 2018-06-18 NOTE — Anesthesia Procedure Notes (Signed)
Procedure Name: Intubation Performed by: Rolla Plate, CRNA Pre-anesthesia Checklist: Patient identified, Patient being monitored, Timeout performed, Emergency Drugs available and Suction available Patient Re-evaluated:Patient Re-evaluated prior to induction Oxygen Delivery Method: Circle system utilized Preoxygenation: Pre-oxygenation with 100% oxygen Induction Type: IV induction Ventilation: Mask ventilation without difficulty Laryngoscope Size: Miller and 2 Grade View: Grade I Tube type: Oral Tube size: 7.0 mm Number of attempts: 1 Airway Equipment and Method: LTA kit utilized Placement Confirmation: ETT inserted through vocal cords under direct vision,  positive ETCO2 and breath sounds checked- equal and bilateral Secured at: 21 cm Tube secured with: Tape Dental Injury: Teeth and Oropharynx as per pre-operative assessment

## 2018-06-18 NOTE — Anesthesia Postprocedure Evaluation (Signed)
Anesthesia Post Note  Patient: Patricia Frazier  Procedure(s) Performed: LUMBAR LAMINECTOMY/DECOMPRESSION MICRODISCECTOMY 1 LEVEL, L5-S1  LEFT (Left Back)  Patient location during evaluation: PACU Anesthesia Type: General Level of consciousness: awake and alert Pain management: pain level controlled Vital Signs Assessment: post-procedure vital signs reviewed and stable Respiratory status: spontaneous breathing, nonlabored ventilation, respiratory function stable and patient connected to nasal cannula oxygen Cardiovascular status: blood pressure returned to baseline and stable Postop Assessment: no apparent nausea or vomiting Anesthetic complications: no     Last Vitals:  Vitals:   06/18/18 1108 06/18/18 1306  BP: 111/81 137/78  Pulse: 65 75  Resp: 12 16  Temp:    SpO2: 100% 95%    Last Pain:  Vitals:   06/18/18 1306  TempSrc:   PainSc: 3                  Filbert Craze Harvie Heck

## 2018-06-18 NOTE — Op Note (Signed)
Indications: Patricia Frazier is a 68 yo female who presented with lumbar radiculopathy due to disc herniation at L5-S1.  She failed extensive conservative management and elected for surgical intervention.  Findings: disc herniation at L5-S1.  Preoperative Diagnosis: Lumbar radiculopathy Postoperative Diagnosis: same   EBL: 25 ml IVF: 600 ml Drains: none Disposition: Extubated and Stable to PACU Complications: none  No foley catheter was placed.   Preoperative Note:   Risks of surgery discussed include: infection, bleeding, stroke, coma, death, paralysis, CSF leak, nerve/spinal cord injury, numbness, tingling, weakness, complex regional pain syndrome, recurrent stenosis and/or disc herniation, vascular injury, development of instability, neck/back pain, need for further surgery, persistent symptoms, development of deformity, and the risks of anesthesia. The patient understood these risks and agreed to proceed.  Operative Note:   1) left L5/S1 microdiscectomy  The patient was then brought from the preoperative center with intravenous access established.  The patient underwent general anesthesia and endotracheal tube intubation, and was then rotated on the [place] rail top where all pressure points were appropriately padded.  The skin was then thoroughly cleansed.  Perioperative antibiotic prophylaxis was administered.  Sterile prep and drapes were then applied and a timeout was then observed.  C-arm was brought into the field under sterile conditions, and the L5-S1 disc space identified and marked with an incision on the left 1cm lateral to midline.  Once this was complete a 3 cm incision was opened with the use of a #10 blade knife.  The Metrx tubes were sequentially advanced under lateral fluoroscopy until a 18 x 60 mm Metrx tube was placed over the facet and lamina and secured to the bed.    The microscope was then sterilely brought into the field and muscle creep was hemostased with a  bipolar and resected with a pituitary rongeur.  A Bovie extender was then used to expose the spinous process and lamina.  Careful attention was placed to not violate the facet capsule. A 3 mm matchstick drill bit was then used to make a hemi-laminotomy trough until the ligamentum flavum was exposed.  This was extended to the base of the spinous process.  Once this was complete and the underlying ligamentum flavum was visualized this was dissected with an up angle curette and resected with a #2 and #3 mm biting Kerrison.  The laminotomy opening was also expanded in similar fashion and hemostasis was obtained with Surgifoam and a patty as well as bone wax.  The rostral aspect of the caudal level of the lamina was also resected with a #2 biting Kerrison effort to further enhance exposure.  Once the underlying dura was visualized a Penfield 4 was then used to dissect and expose the traversing nerve root.  Once this was identified a nerve root retractor suction was used to mobilize this medially.  The venous plexus was hemostased with Surgifoam and light bipolar use.  A small penfield 4 was then used to make a small annulotomy within the disc space and disc space contents were noted to come through the annulus.    The disc herniation was identified and dissected free using a balltip probe. The pituitary rongeur was used to remove the extruded disc fragments. Once the thecal sac and nerve root were noted to be relaxed and under less tension the ball-tipped feeler was passed along the foramen distally to to ensure no residual compression was noted.    A Depo-Medrol soaked Gelfoam pledget was placed along the nerve root for 2 minutes and  removed.  The area was irrigated. The tube system was then removed under microscopic visualization and hemostasis was obtained with a bipolar.    The fascial layer was reapproximated with the use of a 0- Vicryl suture.  Subcutaneous tissue layer was reapproximated using 2-0 Vicryl  suture.  3-0 monocryl was used on the skin. The skin was then cleansed and Dermabond was used to close the skin opening.  Patient was then rotated back to the preoperative bed awakened from anesthesia and taken to recovery all counts are correct in this case.   I performed the entire procedure with the assistance of Patricia Olp PA as an Pensions consultant.  Meade Maw MD

## 2018-06-18 NOTE — Discharge Instructions (Addendum)
°Your surgeon has performed an operation on your lumbar spine (low back) to relieve pressure on one or more nerves. Many times, patients feel better immediately after surgery and can “overdo it.” Even if you feel well, it is important that you follow these activity guidelines. If you do not let your back heal properly from the surgery, you can increase the chance of a disc herniation and/or return of your symptoms. The following are instructions to help in your recovery once you have been discharged from the hospital. ° °* Do not take anti-inflammatory medications for 3 days after surgery (naproxen [Aleve], ibuprofen [Advil, Motrin], celecoxib [Celebrex], etc.) ° °Activity  °  °No bending, lifting, or twisting (“BLT”). Avoid lifting objects heavier than 10 pounds (gallon milk jug).  Where possible, avoid household activities that involve lifting, bending, pushing, or pulling such as laundry, vacuuming, grocery shopping, and childcare. Try to arrange for help from friends and family for these activities while your back heals. ° °Increase physical activity slowly as tolerated.  Taking short walks is encouraged, but avoid strenuous exercise. Do not jog, run, bicycle, lift weights, or participate in any other exercises unless specifically allowed by your doctor. Avoid prolonged sitting, including car rides. ° °Talk to your doctor before resuming sexual activity. ° °You should not drive until cleared by your doctor. ° °Until released by your doctor, you should not return to work or school.  You should rest at home and let your body heal.  ° °You may shower two days after your surgery.  After showering, lightly dab your incision dry. Do not take a tub bath or go swimming for 3 weeks, or until approved by your doctor at your follow-up appointment. ° °If you smoke, we strongly recommend that you quit.  Smoking has been proven to interfere with normal healing in your back and will dramatically reduce the success rate of  your surgery. Please contact [facility] (800-QUIT-NOW) and use the resources at www.[facility].com for assistance in stopping smoking. ° °Surgical Incision °  °If you have a dressing on your incision, you may remove it three days after your surgery. Keep your incision area clean and dry. ° °If you have staples or stitches on your incision, you should have a follow up scheduled for removal. If you do not have staples or stitches, you will have steri-strips (small pieces of surgical tape) or Dermabond glue. The steri-strips/glue should begin to peel away within about a week (it is fine if the steri-strips fall off before then). If the strips are still in place one week after your surgery, you may gently remove them. ° °Diet          ° ° You may return to your usual diet. Be sure to stay hydrated. ° °When to Contact Us ° °Although your surgery and recovery will likely be uneventful, you may have some residual numbness, aches, and pains in your back and/or legs. This is normal and should improve in the next few weeks. ° °However, should you experience any of the following, contact us immediately: °• New numbness or weakness °• Pain that is progressively getting worse, and is not relieved by your pain medications or rest °• Bleeding, redness, swelling, pain, or drainage from surgical incision °• Chills or flu-like symptoms °• Fever greater than 101.0 F (38.3 C) °• Problems with bowel or bladder functions °• Difficulty breathing or shortness of breath °• Warmth, tenderness, or swelling in your calf ° °Contact Information °• During office hours (Monday-Friday   9 am to 5 pm), please call your physician at [phone] °• After hours and weekends, please call the [facility] at [phone] and ask for the Neurosurgery Resident On Call  °• For a life-threatening emergency, call 911 ° °AMBULATORY SURGERY  °DISCHARGE INSTRUCTIONS ° ° °1) The drugs that you were given will stay in your system until tomorrow so for the next 24  hours you should not: ° °A) Drive an automobile °B) Make any legal decisions °C) Drink any alcoholic beverage ° ° °2) You may resume regular meals tomorrow.  Today it is better to start with liquids and gradually work up to solid foods. ° °You may eat anything you prefer, but it is better to start with liquids, then soup and crackers, and gradually work up to solid foods. ° ° °3) Please notify your doctor immediately if you have any unusual bleeding, trouble breathing, redness and pain at the surgery site, drainage, fever, or pain not relieved by medication. ° ° ° °4) Additional Instructions: ° °Please contact your physician with any problems or Same Day Surgery at [phone], Monday through Friday 6 am to 4 pm, or [place] [facility] [place] Main number at [phone]. ° °

## 2018-06-18 NOTE — Transfer of Care (Signed)
Immediate Anesthesia Transfer of Care Note  Patient: Patricia Frazier  Procedure(s) Performed: LUMBAR LAMINECTOMY/DECOMPRESSION MICRODISCECTOMY 1 LEVEL, L5-S1  LEFT (Left Back)  Patient Location: PACU  Anesthesia Type:General  Level of Consciousness: sedated  Airway & Oxygen Therapy: Patient Spontanous Breathing and Patient connected to face mask oxygen  Post-op Assessment: Report given to RN and Post -op Vital signs reviewed and stable  Post vital signs: Reviewed  Last Vitals:  Vitals Value Taken Time  BP 119/73 06/18/2018  9:20 AM  Temp    Pulse 58 06/18/2018  9:20 AM  Resp 15 06/18/2018  9:20 AM  SpO2 100 % 06/18/2018  9:20 AM  Vitals shown include unvalidated device data.  Last Pain:  Vitals:   06/18/18 0622  TempSrc: Tympanic  PainSc: 7          Complications: No apparent anesthesia complications

## 2018-06-18 NOTE — Anesthesia Post-op Follow-up Note (Signed)
Anesthesia QCDR form completed.        

## 2018-06-18 NOTE — Discharge Summary (Signed)
Procedure: L5-S1 lumbar decompression  Procedure date: 06/18/2018 Diagnosis: Lumbar radiculopathy  History: Patricia Frazier is s/p L5-S1 lumbar decompression for lumbar radiculopathy POD0: Evaluated in postop recovery still disoriented from anesthesia but able to answer questions and obey commands.  Complains of 5/10 back pain.  Denies any lower extremity pain at this time, however, symptoms that she was having prior to surgery usually occurred while bearing weight.  Denies any new pain, numbness, tingling in the lower extremities.  Physical Exam: Vitals:   06/18/18 0622  BP: 129/82  Pulse: 67  Resp: 16  Temp: (!) 97 F (36.1 C)  SpO2: 98%   Strength:5/5 throughout lower extremities Sensation: Intact and symmetric throughout lower extremities Skin: Glue intact at incision site  Data:  Recent Labs  Lab 06/16/18 1601  NA 142  K 3.8  CL 107  CO2 26  BUN 16  CREATININE 0.59  GLUCOSE 93  CALCIUM 9.6   No results for input(s): AST, ALT, ALKPHOS in the last 168 hours.  Invalid input(s): TBILI   Recent Labs  Lab 06/16/18 1601  WBC 5.7  HGB 13.9  HCT 41.9  PLT 200   Recent Labs  Lab 06/16/18 1601  APTT 25  INR 0.8         Other tests/results: No imaging reviewed  Assessment/Plan:  Patricia Frazier is POD0 s/p L5-S1 lumbar decompression.  Tolerated procedure well without complication.  Neuro intact postoperatively.  We will continue pain control with oxycodone, Robaxin, Tylenol as needed.  She is scheduled to follow-up in clinic in approximately 2 weeks to monitor progress.  Marin Olp PA-C Department of Neurosurgery

## 2018-06-18 NOTE — Progress Notes (Signed)
Disc material from discectomy

## 2018-06-19 ENCOUNTER — Encounter: Payer: Self-pay | Admitting: Neurosurgery

## 2018-06-24 ENCOUNTER — Other Ambulatory Visit: Payer: Self-pay | Admitting: Adult Health

## 2018-06-24 DIAGNOSIS — E782 Mixed hyperlipidemia: Secondary | ICD-10-CM

## 2018-07-01 ENCOUNTER — Other Ambulatory Visit: Payer: Self-pay | Admitting: Nurse Practitioner

## 2018-07-01 DIAGNOSIS — I10 Essential (primary) hypertension: Secondary | ICD-10-CM

## 2018-07-01 MED ORDER — AMLODIPINE BESYLATE 10 MG PO TABS: 10.0000 mg | ORAL_TABLET | Freq: Every day | ORAL | 2 refills | Status: DC

## 2018-07-24 ENCOUNTER — Other Ambulatory Visit
Admission: RE | Admit: 2018-07-24 | Discharge: 2018-07-24 | Disposition: A | Discharge status: Home / Self Care | Payer: BLUE CROSS/BLUE SHIELD | Attending: Nurse Practitioner | Admitting: Nurse Practitioner

## 2018-07-24 DIAGNOSIS — I10 Essential (primary) hypertension: Secondary | ICD-10-CM | POA: Insufficient documentation

## 2018-07-24 DIAGNOSIS — Z0000 Encounter for general adult medical examination without abnormal findings: Secondary | ICD-10-CM | POA: Insufficient documentation

## 2018-07-24 DIAGNOSIS — E7800 Pure hypercholesterolemia, unspecified: Secondary | ICD-10-CM | POA: Diagnosis not present

## 2018-07-24 DIAGNOSIS — E559 Vitamin D deficiency, unspecified: Secondary | ICD-10-CM | POA: Insufficient documentation

## 2018-07-24 LAB — CBC
HCT: 41.3 % (ref 36.0–46.0)
Hemoglobin: 13.6 g/dL (ref 12.0–15.0)
MCH: 30.5 pg (ref 26.0–34.0)
MCHC: 32.9 g/dL (ref 30.0–36.0)
MCV: 92.6 fL (ref 80.0–100.0)
Platelets: 217 10*3/uL (ref 150–400)
RBC: 4.46 MIL/uL (ref 3.87–5.11)
RDW: 13.6 % (ref 11.5–15.5)
WBC: 5.8 10*3/uL (ref 4.0–10.5)
nRBC: 0 % (ref 0.0–0.2)

## 2018-07-24 LAB — COMPREHENSIVE METABOLIC PANEL
ALT: 23 U/L (ref 0–44)
AST: 26 U/L (ref 15–41)
Albumin: 4.1 g/dL (ref 3.5–5.0)
Alkaline Phosphatase: 63 U/L (ref 38–126)
Anion gap: 9 (ref 5–15)
BUN: 18 mg/dL (ref 8–23)
CO2: 24 mmol/L (ref 22–32)
Calcium: 9.3 mg/dL (ref 8.9–10.3)
Chloride: 106 mmol/L (ref 98–111)
Creatinine, Ser: 0.7 mg/dL (ref 0.44–1.00)
GFR calc Af Amer: >60 mL/min (ref >60)
GFR calc non Af Amer: >60 mL/min (ref >60)
Glucose, Bld: 95 mg/dL (ref 70–99)
Potassium: 3.9 mmol/L (ref 3.5–5.1)
Sodium: 139 mmol/L (ref 135–145)
Total Bilirubin: 0.7 mg/dL (ref 0.3–1.2)
Total Protein: 7.5 g/dL (ref 6.5–8.1)

## 2018-07-24 LAB — TSH: TSH: 1.86 u[IU]/mL (ref 0.350–4.500)

## 2018-07-24 LAB — LIPID PANEL
Cholesterol: 217 mg/dL — ABNORMAL HIGH (ref 0–200)
HDL: 78 mg/dL (ref >40)
LDL Cholesterol: 123 mg/dL — ABNORMAL HIGH (ref 0–99)
Total CHOL/HDL Ratio: 2.8 RATIO
Triglycerides: 80 mg/dL (ref <150)
VLDL: 16 mg/dL (ref 0–40)

## 2018-07-24 LAB — T4, FREE: Free T4: 0.85 ng/dL (ref 0.82–1.77)

## 2018-07-25 LAB — VITAMIN D 25 HYDROXY (VIT D DEFICIENCY, FRACTURES): Vit D, 25-Hydroxy: 30.9 ng/mL (ref 30.0–100.0)

## 2018-08-05 DIAGNOSIS — M545 Low back pain: Secondary | ICD-10-CM | POA: Diagnosis not present

## 2018-08-05 DIAGNOSIS — M6281 Muscle weakness (generalized): Secondary | ICD-10-CM | POA: Diagnosis not present

## 2018-08-07 DIAGNOSIS — M6281 Muscle weakness (generalized): Secondary | ICD-10-CM | POA: Diagnosis not present

## 2018-08-07 DIAGNOSIS — M545 Low back pain: Secondary | ICD-10-CM | POA: Diagnosis not present

## 2018-08-21 DIAGNOSIS — M545 Low back pain: Secondary | ICD-10-CM | POA: Diagnosis not present

## 2018-08-21 DIAGNOSIS — M6281 Muscle weakness (generalized): Secondary | ICD-10-CM | POA: Diagnosis not present

## 2018-08-28 ENCOUNTER — Ambulatory Visit: Payer: Self-pay | Admitting: Nurse Practitioner

## 2018-09-03 DIAGNOSIS — M6281 Muscle weakness (generalized): Secondary | ICD-10-CM | POA: Diagnosis not present

## 2018-09-03 DIAGNOSIS — M545 Low back pain: Secondary | ICD-10-CM | POA: Diagnosis not present

## 2018-09-04 ENCOUNTER — Encounter: Payer: Self-pay | Admitting: Nurse Practitioner

## 2018-09-04 ENCOUNTER — Ambulatory Visit: Payer: BC Managed Care – PPO | Admitting: Nurse Practitioner

## 2018-09-04 ENCOUNTER — Other Ambulatory Visit: Payer: Self-pay

## 2018-09-04 VITALS — BP 125/65 | HR 78 | Resp 16 | Ht 63.0 in | Wt 173.0 lb

## 2018-09-04 DIAGNOSIS — I059 Rheumatic mitral valve disease, unspecified: Secondary | ICD-10-CM | POA: Diagnosis not present

## 2018-09-04 DIAGNOSIS — R600 Localized edema: Secondary | ICD-10-CM

## 2018-09-04 DIAGNOSIS — K219 Gastro-esophageal reflux disease without esophagitis: Secondary | ICD-10-CM

## 2018-09-04 DIAGNOSIS — M5416 Radiculopathy, lumbar region: Secondary | ICD-10-CM

## 2018-09-04 DIAGNOSIS — R300 Dysuria: Secondary | ICD-10-CM

## 2018-09-04 LAB — POCT URINALYSIS DIPSTICK
Bilirubin, UA: NEGATIVE
Blood, UA: NEGATIVE
Glucose, UA: NEGATIVE
Ketones, UA: NEGATIVE
Leukocytes, UA: NEGATIVE
Nitrite, UA: NEGATIVE
Protein, UA: NEGATIVE
Spec Grav, UA: <=1.005 — AB (ref 1.010–1.025)
Urobilinogen, UA: 0.2 E.U./dL
pH, UA: 6.5 (ref 5.0–8.0)

## 2018-09-04 MED ORDER — OMEPRAZOLE MAGNESIUM 20 MG PO TBEC: 20.0000 mg | DELAYED_RELEASE_TABLET | Freq: Every day | ORAL | 4 refills | Status: DC

## 2018-09-04 NOTE — Progress Notes (Signed)
[facility] [place], [place] [address]  Internal MEDICINE  Office Visit Note  Patient Name: Patricia Frazier  [id number]  [id number]  Date of Service: 09/05/2018   Pt is here for a sick visit.  Chief Complaint  Patient presents with  . Urinary Tract Infection    Possible UTI, lower back pain, and frequency, same symptoms as last uti  . Labs Only    pt wants to know results for blood work     The patient is here for sick visit. She is having moderate lower back pain. She did have surgery June 18, 2018. She had UTI prior to surgery and this was treated and cleared. States that she saw surgeon yesterday, and he states that she is healing well from her surgery and perhaps she is having another UTI. Would like her to be checked today.  She denies dysuria, frequency, or blood in the urine.  She states that since she had the surgery, she has had swelling in her left foot and ankle. Gets very tight and very large by the endo of the day. States that she used to wear support socks prior to her surgery, but has not been wearing them since a few days after surgery. She has had echocardiogram done in the past which indicates that she has miral valve disorder, however, she has not had echocardiogram done in some time. She denies chest pain, chest pain, or shortness of breath. Blood pressure is well managed .       Current Medication:  Outpatient Encounter Medications as of 09/04/2018  Medication Sig  . ALPRAZolam (XANAX) 0.25 MG tablet Take 1 tablet (0.25 mg total) by mouth at bedtime as needed for anxiety. (Patient taking differently: Take 0.125 mg by mouth daily. And may take 0.25mg  as needed in the evening)  . amLODipine (NORVASC) 10 MG tablet Take 1 tablet (10 mg total) by mouth daily.  Marland Kitchen ibuprofen (ADVIL) 200 MG tablet Take 400 mg by mouth every 6 (six) hours as needed.  . methocarbamol (ROBAXIN) 500 MG tablet Take 1 tablet (500 mg total) by mouth every 6 (six)  hours as needed for muscle spasms.  . Multiple Vitamin (MULTIVITAMIN) tablet Take 1 tablet by mouth daily. chewable  . omeprazole (PRILOSEC OTC) 20 MG tablet Take 1 tablet (20 mg total) by mouth daily.  Marland Kitchen oxyCODONE (ROXICODONE) 5 MG immediate release tablet Take 1 tablet (5 mg total) by mouth every 4 (four) hours as needed for breakthrough pain.  . pravastatin (PRAVACHOL) 40 MG tablet TAKE 1 TABLET(40 MG) BY MOUTH AT BEDTIME  . [DISCONTINUED] omeprazole (PRILOSEC OTC) 20 MG tablet Take 1 tablet (20 mg total) by mouth daily.  . phentermine (ADIPEX-P) 37.5 MG tablet Take 1 tablet (37.5 mg total) by mouth daily before breakfast. (Patient not taking: Reported on 06/16/2018)  . sulfamethoxazole-trimethoprim (BACTRIM DS,SEPTRA DS) 800-160 MG tablet Take 1 tablet by mouth 2 (two) times daily. (Patient not taking: Reported on 06/12/2018)   No facility-administered encounter medications on file as of 09/04/2018.       Medical History: Past Medical History:  Diagnosis Date  . Anxiety   . Back pain   . Bulging lumbar disc   . Chest pain   . Depression   . GERD (gastroesophageal reflux disease)   . History of kidney stones   . Hypertension   . Migraine   . Sleep apnea    has never used a cpap machine  . Varicose veins of  legs   . Vertigo   . Vision problems      Today's Vitals   09/04/18 1114  BP: 125/65  Pulse: 78  Resp: 16  SpO2: 96%  Weight: 173 lb (78.5 kg)  Height: 5\' 3"  (1.6 m)   Body mass index is 30.65 kg/m.  Review of Systems  Constitutional: Positive for activity change. Negative for chills, fatigue and unexpected weight change.       Limited activity level due to spinal surgery 06/18/2018.   HENT: Negative for congestion, postnasal drip, rhinorrhea, sneezing and sore throat.   Respiratory: Negative for cough, chest tightness and shortness of breath.   Cardiovascular: Negative for chest pain and palpitations.  Gastrointestinal: Negative for abdominal pain, constipation,  diarrhea, nausea and vomiting.  Genitourinary: Positive for flank pain.  Musculoskeletal: Positive for back pain. Negative for arthralgias, joint swelling and neck pain.  Skin: Negative for rash.  Allergic/Immunologic: Negative.   Neurological: Negative for tremors and numbness.  Hematological: Negative for adenopathy. Does not bruise/bleed easily.  Psychiatric/Behavioral: Negative for behavioral problems (Depression), sleep disturbance and suicidal ideas. The patient is nervous/anxious.     Physical Exam Vitals signs and nursing note reviewed.  Constitutional:      General: She is not in acute distress.    Appearance: Normal appearance. She is well-developed. She is not diaphoretic.  HENT:     Head: Normocephalic and atraumatic.     Mouth/Throat:     Pharynx: No oropharyngeal exudate.  Eyes:     Conjunctiva/sclera: Conjunctivae normal.     Pupils: Pupils are equal, round, and reactive to light.  Neck:     Musculoskeletal: Normal range of motion and neck supple.     Thyroid: No thyromegaly.     Vascular: No carotid bruit or JVD.     Trachea: No tracheal deviation.  Cardiovascular:     Rate and Rhythm: Normal rate and regular rhythm.     Pulses: Normal pulses.     Heart sounds: Normal heart sounds. No murmur. No friction rub. No gallop.   Pulmonary:     Effort: Pulmonary effort is normal. No respiratory distress.     Breath sounds: Normal breath sounds. No wheezing or rales.  Chest:     Chest wall: No tenderness.     Breasts:        Right: Normal. No swelling, bleeding, inverted nipple, mass, nipple discharge, skin change or tenderness.        Left: Normal. No swelling, bleeding, inverted nipple, mass, nipple discharge, skin change or tenderness.  Abdominal:     General: Bowel sounds are normal.     Palpations: Abdomen is soft.  Genitourinary:    Comments: Urine sample negative for nifection or other abnormalities.  Musculoskeletal: Normal range of motion.     Comments:  The patient has moderate mod back pain.   Lymphadenopathy:     Cervical: No cervical adenopathy.  Skin:    General: Skin is warm and dry.     Comments: Multiple dark brown lesions on back and abdomen. They are all greater than 74mm in diameter with irregular borders. They are rough and flaky in texture.   Neurological:     General: No focal deficit present.     Mental Status: She is alert and oriented to person, place, and time.     Cranial Nerves: No cranial nerve deficit.  Psychiatric:        Behavior: Behavior normal.        Thought  Content: Thought content normal.        Judgment: Judgment normal.    Assessment/Plan: 1. Dysuria - POCT Urinalysis Dipstick negative for evidence of infection or other abnormality today.  2. Mitral valve disorder - ECHOCARDIOGRAM COMPLETE; Future  3. Pedal edema - ECHOCARDIOGRAM COMPLETE; Future  4. Gastro-esophageal reflux disease without esophagitis - omeprazole (PRILOSEC OTC) 20 MG tablet; Take 1 tablet (20 mg total) by mouth daily.  Dispense: 90 tablet; Refill: 4  5. Lumbar radiculopathy Back pain likely stemming from surgery 06/18/2018. Recommended she rest, use prescribed muscle relaxer, and use heating pad to help relieve pain.   General Counseling: ruthellen tippy understanding of the findings of todays visit and agrees with plan of treatment. I have discussed any further diagnostic evaluation that may be needed or ordered today. We also reviewed her medications today. she has been encouraged to call the office with any questions or concerns that should arise related to todays visit.    Counseling:  This patient was seen by Leretha Pol FNP Collaboration with Dr Lavera Guise as a part of collaborative care agreement  Orders Placed This Encounter  Procedures  . POCT Urinalysis Dipstick  . ECHOCARDIOGRAM COMPLETE    Meds ordered this encounter  Medications  . omeprazole (PRILOSEC OTC) 20 MG tablet    Sig: Take 1 tablet (20 mg  total) by mouth daily.    Dispense:  90 tablet    Refill:  4    Please change to 90 day prescription. [email or web address]    Order Specific Question:   Supervising Provider    Answer:   Lavera Guise [[id number]]    Time spent: 25 Minutes

## 2018-09-05 ENCOUNTER — Other Ambulatory Visit: Payer: Self-pay

## 2018-09-05 DIAGNOSIS — M6281 Muscle weakness (generalized): Secondary | ICD-10-CM | POA: Diagnosis not present

## 2018-09-05 DIAGNOSIS — K219 Gastro-esophageal reflux disease without esophagitis: Secondary | ICD-10-CM

## 2018-09-05 DIAGNOSIS — M545 Low back pain: Secondary | ICD-10-CM | POA: Diagnosis not present

## 2018-09-05 DIAGNOSIS — R600 Localized edema: Secondary | ICD-10-CM | POA: Insufficient documentation

## 2018-09-05 MED ORDER — OMEPRAZOLE MAGNESIUM 20 MG PO TBEC: 20.0000 mg | DELAYED_RELEASE_TABLET | Freq: Every day | ORAL | 3 refills | Status: DC

## 2018-09-10 DIAGNOSIS — M6281 Muscle weakness (generalized): Secondary | ICD-10-CM | POA: Diagnosis not present

## 2018-09-10 DIAGNOSIS — M545 Low back pain: Secondary | ICD-10-CM | POA: Diagnosis not present

## 2018-09-12 ENCOUNTER — Other Ambulatory Visit: Payer: Self-pay

## 2018-09-12 ENCOUNTER — Ambulatory Visit: Payer: BC Managed Care – PPO

## 2018-09-12 DIAGNOSIS — M545 Low back pain: Secondary | ICD-10-CM | POA: Diagnosis not present

## 2018-09-12 DIAGNOSIS — R600 Localized edema: Secondary | ICD-10-CM

## 2018-09-12 DIAGNOSIS — R300 Dysuria: Secondary | ICD-10-CM

## 2018-09-12 DIAGNOSIS — M6281 Muscle weakness (generalized): Secondary | ICD-10-CM | POA: Diagnosis not present

## 2018-09-19 ENCOUNTER — Ambulatory Visit: Payer: Self-pay | Admitting: Nurse Practitioner

## 2018-10-07 NOTE — Telephone Encounter (Signed)
Pt has been advised of urine sample from 06-11-18.

## 2018-10-16 DIAGNOSIS — L570 Actinic keratosis: Secondary | ICD-10-CM | POA: Diagnosis not present

## 2018-10-16 DIAGNOSIS — D2272 Melanocytic nevi of left lower limb, including hip: Secondary | ICD-10-CM | POA: Diagnosis not present

## 2018-10-16 DIAGNOSIS — D2262 Melanocytic nevi of left upper limb, including shoulder: Secondary | ICD-10-CM | POA: Diagnosis not present

## 2018-10-16 DIAGNOSIS — R208 Other disturbances of skin sensation: Secondary | ICD-10-CM | POA: Diagnosis not present

## 2018-10-16 DIAGNOSIS — L538 Other specified erythematous conditions: Secondary | ICD-10-CM | POA: Diagnosis not present

## 2018-10-16 DIAGNOSIS — L820 Inflamed seborrheic keratosis: Secondary | ICD-10-CM | POA: Diagnosis not present

## 2018-10-16 DIAGNOSIS — D2261 Melanocytic nevi of right upper limb, including shoulder: Secondary | ICD-10-CM | POA: Diagnosis not present

## 2018-10-16 DIAGNOSIS — D225 Melanocytic nevi of trunk: Secondary | ICD-10-CM | POA: Diagnosis not present

## 2018-11-17 ENCOUNTER — Ambulatory Visit (INDEPENDENT_AMBULATORY_CARE_PROVIDER_SITE_OTHER): Payer: BC Managed Care – PPO | Admitting: Nurse Practitioner

## 2018-11-17 ENCOUNTER — Encounter: Payer: Self-pay | Admitting: Nurse Practitioner

## 2018-11-17 ENCOUNTER — Other Ambulatory Visit: Payer: Self-pay

## 2018-11-17 VITALS — BP 134/78 | HR 80 | Resp 16 | Ht 63.0 in | Wt 173.0 lb

## 2018-11-17 DIAGNOSIS — R300 Dysuria: Secondary | ICD-10-CM

## 2018-11-17 DIAGNOSIS — I10 Essential (primary) hypertension: Secondary | ICD-10-CM

## 2018-11-17 DIAGNOSIS — Z79899 Other long term (current) drug therapy: Secondary | ICD-10-CM | POA: Diagnosis not present

## 2018-11-17 DIAGNOSIS — F411 Generalized anxiety disorder: Secondary | ICD-10-CM

## 2018-11-17 DIAGNOSIS — R600 Localized edema: Secondary | ICD-10-CM

## 2018-11-17 LAB — POCT URINALYSIS DIPSTICK
Bilirubin, UA: NEGATIVE
Blood, UA: NEGATIVE
Glucose, UA: NEGATIVE
Ketones, UA: NEGATIVE
Leukocytes, UA: NEGATIVE
Nitrite, UA: NEGATIVE
Protein, UA: NEGATIVE
Spec Grav, UA: 1.01 (ref 1.010–1.025)
Urobilinogen, UA: 0.2 E.U./dL
pH, UA: 5 (ref 5.0–8.0)

## 2018-11-17 LAB — POCT URINE DRUG SCREEN
POC Amphetamine UR: POSITIVE — AB
POC BENZODIAZEPINES UR: NOT DETECTED
POC Barbiturate UR: NOT DETECTED
POC Cocaine UR: NOT DETECTED
POC Ecstasy UR: NOT DETECTED
POC Marijuana UR: NOT DETECTED
POC Methadone UR: NOT DETECTED
POC Methamphetamine UR: NOT DETECTED
POC Opiate Ur: NOT DETECTED
POC Oxycodone UR: NOT DETECTED
POC PHENCYCLIDINE UR: NOT DETECTED
POC TRICYCLICS UR: NOT DETECTED

## 2018-11-17 MED ORDER — ALPRAZOLAM 0.25 MG PO TABS: 0.2500 mg | ORAL_TABLET | Freq: Every evening | ORAL | 3 refills | Status: DC | PRN

## 2018-11-17 NOTE — Progress Notes (Signed)
[facility] [place], [place] [address]  Internal MEDICINE  Office Visit Note  Patient Name: Patricia Frazier  [id number]  [id number]  Date of Service: 11/17/2018  Chief Complaint  Patient presents with  . Follow-up  . Anxiety  . Depression  . Gastroesophageal Reflux  . Hypertension  . Urinary Tract Infection  . Medication Refill    xanax    The patient is here for routine follow up visit. She states that she has had a hard time with healing after surgery on her lumbar spine. Is on more medications to help relieve pain and she is unable to participate in routine exercise as this causes her so much pain. Now on gabapentin and robaxin to just get through the day. She has trouble sleeping because pain is so severe.  She goes back to see surgeon later this month. She also had multiple pre-cancerous lesions removed from hand, back, and chest. She will go back to the dermatologist later this month as well. Echocardiogram was done since her last visit, as she continues to have swelling in the left loewr extremity. Echo showing normal size and function of left ventricle and normal valves. Blood pressure remains well controlled.       Current Medication: Outpatient Encounter Medications as of 11/17/2018  Medication Sig  . ALPRAZolam (XANAX) 0.25 MG tablet Take 1 tablet (0.25 mg total) by mouth at bedtime as needed for anxiety.  Marland Kitchen amLODipine (NORVASC) 10 MG tablet Take 1 tablet (10 mg total) by mouth daily.  Marland Kitchen gabapentin (NEURONTIN) 100 MG capsule Take 100 mg by mouth as needed.  Marland Kitchen ibuprofen (ADVIL) 200 MG tablet Take 400 mg by mouth every 6 (six) hours as needed.  . methocarbamol (ROBAXIN) 500 MG tablet Take 1 tablet (500 mg total) by mouth every 6 (six) hours as needed for muscle spasms.  . Multiple Vitamin (MULTIVITAMIN) tablet Take 1 tablet by mouth daily. chewable  . omeprazole (PRILOSEC OTC) 20 MG tablet Take 1 tablet (20 mg total) by mouth daily.  .  phentermine (ADIPEX-P) 37.5 MG tablet Take 1 tablet (37.5 mg total) by mouth daily before breakfast.  . pravastatin (PRAVACHOL) 40 MG tablet TAKE 1 TABLET(40 MG) BY MOUTH AT BEDTIME  . [DISCONTINUED] ALPRAZolam (XANAX) 0.25 MG tablet Take 1 tablet (0.25 mg total) by mouth at bedtime as needed for anxiety. (Patient taking differently: Take 0.125 mg by mouth daily. And may take 0.25mg  as needed in the evening)  . [DISCONTINUED] oxyCODONE (ROXICODONE) 5 MG immediate release tablet Take 1 tablet (5 mg total) by mouth every 4 (four) hours as needed for breakthrough pain. (Patient not taking: Reported on 11/17/2018)  . [DISCONTINUED] sulfamethoxazole-trimethoprim (BACTRIM DS,SEPTRA DS) 800-160 MG tablet Take 1 tablet by mouth 2 (two) times daily. (Patient not taking: Reported on 06/12/2018)   No facility-administered encounter medications on file as of 11/17/2018.     Surgical History: Past Surgical History:  Procedure Laterality Date  . BREAST CYST ASPIRATION Left yrs ago  . LUMBAR LAMINECTOMY/DECOMPRESSION MICRODISCECTOMY Left 06/18/2018   Procedure: LUMBAR LAMINECTOMY/DECOMPRESSION MICRODISCECTOMY 1 LEVEL, L5-S1  LEFT;  Surgeon: Meade Maw, MD;  Location: [facility] ORS;  Service: Neurosurgery;  Laterality: Left;  . STENT PLACE LEFT URETER ([place] HX)     unsure  of side  . TONSILLECTOMY    . tubiligation      Medical History: Past Medical History:  Diagnosis Date  . Anxiety   . Back pain   . Bulging lumbar disc   .  Chest pain   . Depression   . GERD (gastroesophageal reflux disease)   . History of kidney stones   . Hypertension   . Migraine   . Sleep apnea    has never used a cpap machine  . Varicose veins of legs   . Vertigo   . Vision problems     Family History: Family History  Problem Relation Age of Onset  . Hypertension Mother   . Cancer Father   . Hypertension Father   . Hyperlipidemia Father     Social History   Socioeconomic History  . Marital status: Married     Spouse name: Merry Proud  . Number of children: Not on file  . Years of education: Not on file  . Highest education level: Not on file  Occupational History  . 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 Financial resource strain: Not on file  . Food insecurity    Worry: Not on file    Inability: Not on file  . Transportation needs    Medical: Not on file    Non-medical: Not on file  Tobacco Use  . Smoking status: Former Smoker    Types: Cigarettes    Quit date: 2000    Years since quitting: 20.6  . Smokeless tobacco: Never Used  Substance and Sexual Activity  . Alcohol use: Yes    Comment: socially  . Drug use: No  . Sexual activity: Not Currently  Lifestyle  . Physical activity    Days per week: Not on file    Minutes per session: Not on file  . Stress: Not on file  Relationships  . Social Herbalist on phone: Not on file    Gets together: Not on file    Attends religious service: Not on file    Active member of club or organization: Not on file    Attends meetings of clubs or organizations: Not on file    Relationship status: Not on file  . Intimate partner violence    Fear of current or ex partner: Not on file    Emotionally abused: Not on file    Physically abused: Not on file    Forced sexual activity: Not on file  Other Topics Concern  . Not on file  Social History Narrative  . Not on file      Review of Systems  Constitutional: Positive for activity change. Negative for chills, fatigue and unexpected weight change.       Limited activity level due to spinal surgery 06/18/2018. No change in weight since she was last seen.   HENT: Negative for congestion, postnasal drip, rhinorrhea, sneezing and sore throat.   Respiratory: Negative for cough, chest tightness and shortness of breath.   Cardiovascular: Positive for leg swelling. Negative for chest pain and palpitations.       Left leg continuing to swell.    Gastrointestinal: Negative for abdominal pain, constipation, diarrhea, nausea and vomiting.  Endocrine: Negative for cold intolerance, heat intolerance, polydipsia and polyuria.  Musculoskeletal: Positive for back pain and myalgias. Negative for arthralgias, joint swelling and neck pain.  Skin: Negative for rash.  Allergic/Immunologic: Negative.   Neurological: Negative for tremors and numbness.  Hematological: Negative for adenopathy. Does not bruise/bleed easily.  Psychiatric/Behavioral: Negative for behavioral problems (Depression), sleep disturbance and suicidal ideas. The patient is nervous/anxious.     Today's Vitals   11/17/18 1626  BP: 134/78  Pulse: 80  Resp: 16  SpO2: 97%  Weight: 173 lb (78.5 kg)  Height: 5\' 3"  (1.6 m)   Body mass index is 30.65 kg/m.  Physical Exam Vitals signs and nursing note reviewed.  Constitutional:      General: She is not in acute distress.    Appearance: Normal appearance. She is well-developed. She is not diaphoretic.  HENT:     Head: Normocephalic and atraumatic.     Mouth/Throat:     Pharynx: No oropharyngeal exudate.  Eyes:     Conjunctiva/sclera: Conjunctivae normal.     Pupils: Pupils are equal, round, and reactive to light.  Neck:     Musculoskeletal: Normal range of motion and neck supple.     Thyroid: No thyromegaly.     Vascular: No carotid bruit or JVD.     Trachea: No tracheal deviation.  Cardiovascular:     Rate and Rhythm: Normal rate and regular rhythm.     Pulses: Normal pulses.     Heart sounds: Normal heart sounds. No murmur. No friction rub. No gallop.   Pulmonary:     Effort: Pulmonary effort is normal. No respiratory distress.     Breath sounds: Normal breath sounds. No wheezing or rales.  Chest:     Chest wall: No tenderness.     Breasts:        Right: Normal. No swelling, bleeding, inverted nipple, mass, nipple discharge, skin change or tenderness.        Left: Normal. No swelling, bleeding, inverted  nipple, mass, nipple discharge, skin change or tenderness.  Abdominal:     General: Bowel sounds are normal.     Palpations: Abdomen is soft.  Genitourinary:    Comments: Urine sample negative for nifection or other abnormalities.  Musculoskeletal: Normal range of motion.     Comments: The patient has moderate mod back pain.   Lymphadenopathy:     Cervical: No cervical adenopathy.  Skin:    General: Skin is warm and dry.  Neurological:     General: No focal deficit present.     Mental Status: She is alert and oriented to person, place, and time.     Cranial Nerves: No cranial nerve deficit.  Psychiatric:        Behavior: Behavior normal.        Thought Content: Thought content normal.        Judgment: Judgment normal.   Assessment/Plan:  1. Essential (primary) hypertension Stable. Continue BP medication as prescribed   2. Pedal edema Reviewed echo which was unremarkable. Advised she continue to wear compression socks every day.  3. GAD (generalized anxiety disorder) May continue to take alprazolam 0.25mg  at bedtime as needed for acute anxiety. New prescription provided today.  - ALPRAZolam (XANAX) 0.25 MG tablet; Take 1 tablet (0.25 mg total) by mouth at bedtime as needed for anxiety.  Dispense: 30 tablet; Refill: 3  4. Encounter for long-term (current) use of medications - POCT Urine Drug Screen positive for AMP as she has been intermittently taking phentermine.   5. Dysuria - POCT Urinalysis Dipstick without abnormality.   General Counseling: kamesha herne understanding of the findings of todays visit and agrees with plan of treatment. I have discussed any further diagnostic evaluation that may be needed or ordered today. We also reviewed her medications today. she has been encouraged to call the office with any questions or concerns that should arise related to todays visit.  Hypertension Counseling:   The following hypertensive lifestyle  modification were  recommended and discussed:  1. Limiting alcohol intake to less than 1 oz/day of ethanol:(24 oz of beer or 8 oz of wine or 2 oz of 100-proof whiskey). 2. Take baby ASA 81 mg daily. 3. Importance of regular aerobic exercise and losing weight. 4. Reduce dietary saturated fat and cholesterol intake for overall cardiovascular health. 5. Maintaining adequate dietary potassium, calcium, and magnesium intake. 6. Regular monitoring of the blood pressure. 7. Reduce sodium intake to less than 100 mmol/day (less than 2.3 gm of sodium or less than 6 gm of sodium choride)   This patient was seen by [place] with Dr Lavera Guise as a part of collaborative care agreement  Orders Placed This Encounter  Procedures  . POCT Urinalysis Dipstick  . POCT Urine Drug Screen    Meds ordered this encounter  Medications  . ALPRAZolam (XANAX) 0.25 MG tablet    Sig: Take 1 tablet (0.25 mg total) by mouth at bedtime as needed for anxiety.    Dispense:  30 tablet    Refill:  3    Order Specific Question:   Supervising Provider    Answer:   Lavera Guise [[id number]]    Time spent: 78 Minutes      Dr Lavera Guise Internal medicine

## 2018-11-27 DIAGNOSIS — M5416 Radiculopathy, lumbar region: Secondary | ICD-10-CM | POA: Diagnosis not present

## 2018-11-28 DIAGNOSIS — L820 Inflamed seborrheic keratosis: Secondary | ICD-10-CM | POA: Diagnosis not present

## 2018-11-28 DIAGNOSIS — L538 Other specified erythematous conditions: Secondary | ICD-10-CM | POA: Diagnosis not present

## 2018-12-01 DIAGNOSIS — M25551 Pain in right hip: Secondary | ICD-10-CM | POA: Diagnosis not present

## 2018-12-01 DIAGNOSIS — M5416 Radiculopathy, lumbar region: Secondary | ICD-10-CM | POA: Diagnosis not present

## 2018-12-02 DIAGNOSIS — M5416 Radiculopathy, lumbar region: Secondary | ICD-10-CM | POA: Diagnosis not present

## 2018-12-02 DIAGNOSIS — M545 Low back pain: Secondary | ICD-10-CM | POA: Diagnosis not present

## 2018-12-04 DIAGNOSIS — R202 Paresthesia of skin: Secondary | ICD-10-CM | POA: Diagnosis not present

## 2018-12-05 DIAGNOSIS — M5416 Radiculopathy, lumbar region: Secondary | ICD-10-CM | POA: Diagnosis not present

## 2018-12-05 DIAGNOSIS — M545 Low back pain: Secondary | ICD-10-CM | POA: Diagnosis not present

## 2018-12-09 DIAGNOSIS — M545 Low back pain: Secondary | ICD-10-CM | POA: Diagnosis not present

## 2018-12-09 DIAGNOSIS — M5416 Radiculopathy, lumbar region: Secondary | ICD-10-CM | POA: Diagnosis not present

## 2018-12-10 DIAGNOSIS — M5416 Radiculopathy, lumbar region: Secondary | ICD-10-CM | POA: Diagnosis not present

## 2018-12-12 DIAGNOSIS — M545 Low back pain: Secondary | ICD-10-CM | POA: Diagnosis not present

## 2018-12-12 DIAGNOSIS — M5416 Radiculopathy, lumbar region: Secondary | ICD-10-CM | POA: Diagnosis not present

## 2018-12-17 DIAGNOSIS — M5416 Radiculopathy, lumbar region: Secondary | ICD-10-CM | POA: Diagnosis not present

## 2018-12-17 DIAGNOSIS — M545 Low back pain: Secondary | ICD-10-CM | POA: Diagnosis not present

## 2018-12-19 DIAGNOSIS — M545 Low back pain: Secondary | ICD-10-CM | POA: Diagnosis not present

## 2018-12-19 DIAGNOSIS — M5416 Radiculopathy, lumbar region: Secondary | ICD-10-CM | POA: Diagnosis not present

## 2018-12-24 DIAGNOSIS — M545 Low back pain: Secondary | ICD-10-CM | POA: Diagnosis not present

## 2018-12-24 DIAGNOSIS — M5416 Radiculopathy, lumbar region: Secondary | ICD-10-CM | POA: Diagnosis not present

## 2018-12-30 DIAGNOSIS — M5416 Radiculopathy, lumbar region: Secondary | ICD-10-CM | POA: Diagnosis not present

## 2018-12-30 DIAGNOSIS — M545 Low back pain: Secondary | ICD-10-CM | POA: Diagnosis not present

## 2018-12-31 DIAGNOSIS — M5417 Radiculopathy, lumbosacral region: Secondary | ICD-10-CM | POA: Diagnosis not present

## 2019-01-01 DIAGNOSIS — M545 Low back pain: Secondary | ICD-10-CM | POA: Diagnosis not present

## 2019-01-01 DIAGNOSIS — M5416 Radiculopathy, lumbar region: Secondary | ICD-10-CM | POA: Diagnosis not present

## 2019-01-04 ENCOUNTER — Other Ambulatory Visit: Payer: Self-pay | Admitting: Internal Medicine

## 2019-01-04 ENCOUNTER — Other Ambulatory Visit: Payer: Self-pay | Admitting: Adult Health

## 2019-01-04 DIAGNOSIS — E782 Mixed hyperlipidemia: Secondary | ICD-10-CM

## 2019-01-04 DIAGNOSIS — E663 Overweight: Secondary | ICD-10-CM

## 2019-01-13 DIAGNOSIS — M545 Low back pain: Secondary | ICD-10-CM | POA: Diagnosis not present

## 2019-01-13 DIAGNOSIS — M5416 Radiculopathy, lumbar region: Secondary | ICD-10-CM | POA: Diagnosis not present

## 2019-01-15 DIAGNOSIS — M5416 Radiculopathy, lumbar region: Secondary | ICD-10-CM | POA: Diagnosis not present

## 2019-01-15 DIAGNOSIS — M545 Low back pain: Secondary | ICD-10-CM | POA: Diagnosis not present

## 2019-01-19 DIAGNOSIS — M545 Low back pain: Secondary | ICD-10-CM | POA: Diagnosis not present

## 2019-01-19 DIAGNOSIS — M5416 Radiculopathy, lumbar region: Secondary | ICD-10-CM | POA: Diagnosis not present

## 2019-01-22 DIAGNOSIS — M545 Low back pain: Secondary | ICD-10-CM | POA: Diagnosis not present

## 2019-01-22 DIAGNOSIS — M5416 Radiculopathy, lumbar region: Secondary | ICD-10-CM | POA: Diagnosis not present

## 2019-01-28 DIAGNOSIS — M545 Low back pain: Secondary | ICD-10-CM | POA: Diagnosis not present

## 2019-01-28 DIAGNOSIS — M5416 Radiculopathy, lumbar region: Secondary | ICD-10-CM | POA: Diagnosis not present

## 2019-01-30 DIAGNOSIS — M5416 Radiculopathy, lumbar region: Secondary | ICD-10-CM | POA: Diagnosis not present

## 2019-01-30 DIAGNOSIS — M545 Low back pain: Secondary | ICD-10-CM | POA: Diagnosis not present

## 2019-02-02 DIAGNOSIS — M5416 Radiculopathy, lumbar region: Secondary | ICD-10-CM | POA: Diagnosis not present

## 2019-02-02 DIAGNOSIS — M545 Low back pain: Secondary | ICD-10-CM | POA: Diagnosis not present

## 2019-02-05 DIAGNOSIS — M545 Low back pain: Secondary | ICD-10-CM | POA: Diagnosis not present

## 2019-02-05 DIAGNOSIS — M5416 Radiculopathy, lumbar region: Secondary | ICD-10-CM | POA: Diagnosis not present

## 2019-02-10 DIAGNOSIS — M5416 Radiculopathy, lumbar region: Secondary | ICD-10-CM | POA: Diagnosis not present

## 2019-02-10 DIAGNOSIS — M545 Low back pain: Secondary | ICD-10-CM | POA: Diagnosis not present

## 2019-02-12 DIAGNOSIS — M5416 Radiculopathy, lumbar region: Secondary | ICD-10-CM | POA: Diagnosis not present

## 2019-02-12 DIAGNOSIS — M545 Low back pain: Secondary | ICD-10-CM | POA: Diagnosis not present

## 2019-02-17 DIAGNOSIS — M5416 Radiculopathy, lumbar region: Secondary | ICD-10-CM | POA: Diagnosis not present

## 2019-02-17 DIAGNOSIS — M545 Low back pain: Secondary | ICD-10-CM | POA: Diagnosis not present

## 2019-02-25 DIAGNOSIS — H40013 Open angle with borderline findings, low risk, bilateral: Secondary | ICD-10-CM | POA: Diagnosis not present

## 2019-03-13 DIAGNOSIS — M5416 Radiculopathy, lumbar region: Secondary | ICD-10-CM | POA: Diagnosis not present

## 2019-03-13 DIAGNOSIS — M545 Low back pain: Secondary | ICD-10-CM | POA: Diagnosis not present

## 2019-03-18 ENCOUNTER — Telehealth: Payer: Self-pay

## 2019-03-18 NOTE — Telephone Encounter (Signed)
LMOM FOR PATIENT TO CONFIRM AND SCREEN FOR 03-20-19 OV. °

## 2019-03-19 ENCOUNTER — Telehealth: Payer: Self-pay

## 2019-03-19 NOTE — Telephone Encounter (Signed)
CONFIRMED AND SCREENED FOR 03-20-19 OV.

## 2019-03-20 ENCOUNTER — Ambulatory Visit: Payer: BC Managed Care – PPO | Admitting: Nurse Practitioner

## 2019-03-20 ENCOUNTER — Encounter: Payer: Self-pay | Admitting: Nurse Practitioner

## 2019-03-20 ENCOUNTER — Other Ambulatory Visit: Payer: Self-pay

## 2019-03-20 VITALS — BP 136/80 | HR 80 | Temp 97.8°F | Resp 16 | Ht 63.0 in | Wt 166.2 lb

## 2019-03-20 DIAGNOSIS — I10 Essential (primary) hypertension: Secondary | ICD-10-CM | POA: Diagnosis not present

## 2019-03-20 DIAGNOSIS — F411 Generalized anxiety disorder: Secondary | ICD-10-CM | POA: Diagnosis not present

## 2019-03-20 DIAGNOSIS — E663 Overweight: Secondary | ICD-10-CM | POA: Diagnosis not present

## 2019-03-20 MED ORDER — AMLODIPINE BESYLATE 10 MG PO TABS: 10.0000 mg | ORAL_TABLET | Freq: Every day | ORAL | 2 refills | Status: DC

## 2019-03-20 MED ORDER — PHENTERMINE HCL 37.5 MG PO TABS: 37.5000 mg | ORAL_TABLET | Freq: Every day | ORAL | 1 refills | Status: DC

## 2019-03-20 NOTE — Progress Notes (Signed)
[facility] [place], [place] [address]  Internal MEDICINE  Office Visit Note  Patient Name: Patricia Frazier  [id number]  [id number]  Date of Service: 03/22/2019  Chief Complaint  Patient presents with  . Hypertension    The patient is here for routine follow up. Blood pressure slightly elevated. Has been out all day shopping and rushed to the office to get to her visit on time. Her blood pressure is generally well controlled. She is doing well on weight loss journey and is doing well without stimulant appetite suppressant. She has lost seven pounds since her last visit. She would like to be restarted on phentermine to help her continue with weight management. She has taken this in the past and done well without negative side effects.       Current Medication: Outpatient Encounter Medications as of 03/20/2019  Medication Sig  . ALPRAZolam (XANAX) 0.25 MG tablet Take 1 tablet (0.25 mg total) by mouth at bedtime as needed for anxiety.  Marland Kitchen amLODipine (NORVASC) 10 MG tablet Take 1 tablet (10 mg total) by mouth daily.  Marland Kitchen gabapentin (NEURONTIN) 100 MG capsule Take 100 mg by mouth as needed.  Marland Kitchen ibuprofen (ADVIL) 200 MG tablet Take 400 mg by mouth every 6 (six) hours as needed.  . methocarbamol (ROBAXIN) 500 MG tablet Take 1 tablet (500 mg total) by mouth every 6 (six) hours as needed for muscle spasms.  . Multiple Vitamin (MULTIVITAMIN) tablet Take 1 tablet by mouth daily. chewable  . omeprazole (PRILOSEC OTC) 20 MG tablet Take 1 tablet (20 mg total) by mouth daily.  . phentermine (ADIPEX-P) 37.5 MG tablet Take 1 tablet (37.5 mg total) by mouth daily before breakfast.  . pravastatin (PRAVACHOL) 40 MG tablet TAKE 1 TABLET(40 MG) BY MOUTH AT BEDTIME  . [DISCONTINUED] amLODipine (NORVASC) 10 MG tablet Take 1 tablet (10 mg total) by mouth daily.  . [DISCONTINUED] phentermine (ADIPEX-P) 37.5 MG tablet Take 1 tablet (37.5 mg total) by mouth daily before breakfast.    No facility-administered encounter medications on file as of 03/20/2019.    Surgical History: Past Surgical History:  Procedure Laterality Date  . BREAST CYST ASPIRATION Left yrs ago  . LUMBAR LAMINECTOMY/DECOMPRESSION MICRODISCECTOMY Left 06/18/2018   Procedure: LUMBAR LAMINECTOMY/DECOMPRESSION MICRODISCECTOMY 1 LEVEL, L5-S1  LEFT;  Surgeon: Meade Maw, MD;  Location: [facility] ORS;  Service: Neurosurgery;  Laterality: Left;  . STENT PLACE LEFT URETER ([place] HX)     unsure  of side  . TONSILLECTOMY    . tubiligation      Medical History: Past Medical History:  Diagnosis Date  . Anxiety   . Back pain   . Bulging lumbar disc   . Chest pain   . Depression   . GERD (gastroesophageal reflux disease)   . History of kidney stones   . Hypertension   . Migraine   . Sleep apnea    has never used a cpap machine  . Varicose veins of legs   . Vertigo   . Vision problems     Family History: Family History  Problem Relation Age of Onset  . Hypertension Mother   . Cancer Father   . Hypertension Father   . Hyperlipidemia Father     Social History   Socioeconomic History  . Marital status: Married    Spouse name: Merry Proud  . Number of children: Not on file  . Years of education: Not on file  . Highest education level: Not on file  Occupational History  . 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 Smoking status: Former Smoker    Types: Cigarettes    Quit date: 2000    Years since quitting: 20.9  . Smokeless tobacco: Never Used  Substance and Sexual Activity  . Alcohol use: Yes    Comment: socially  . Drug use: No  . Sexual activity: Not Currently  Other Topics Concern  . Not on file  Social History Narrative  . Not on file   Social Determinants of Health   Financial Resource Strain:   . Difficulty of Paying Living Expenses: Not on file  Food Insecurity:   . Worried About Charity fundraiser in the Last Year: Not on file  . Ran  Out of Food in the Last Year: Not on file  Transportation Needs:   . Lack of Transportation (Medical): Not on file  . Lack of Transportation (Non-Medical): Not on file  Physical Activity:   . Days of Exercise per Week: Not on file  . Minutes of Exercise per Session: Not on file  Stress:   . Feeling of Stress : Not on file  Social Connections:   . Frequency of Communication with Friends and Family: Not on file  . Frequency of Social Gatherings with Friends and Family: Not on file  . Attends Religious Services: Not on file  . Active Member of Clubs or Organizations: Not on file  . Attends Archivist Meetings: Not on file  . Marital Status: Not on file  Intimate Partner Violence:   . Fear of Current or Ex-Partner: Not on file  . Emotionally Abused: Not on file  . Physically Abused: Not on file  . Sexually Abused: Not on file      Review of Systems  Constitutional: Positive for activity change. Negative for chills, fatigue and unexpected weight change.       Limited activity level due to spinal surgery 06/18/2018.she has been able to lose seven pounds since her last visit.   HENT: Negative for congestion, postnasal drip, rhinorrhea, sneezing and sore throat.   Respiratory: Negative for cough, chest tightness and shortness of breath.   Cardiovascular: Negative for chest pain, palpitations and leg swelling.       Blood pressure mildly elevated.   Gastrointestinal: Negative for abdominal pain, constipation, diarrhea, nausea and vomiting.  Endocrine: Negative for cold intolerance, heat intolerance, polydipsia and polyuria.  Musculoskeletal: Positive for back pain and myalgias. Negative for arthralgias, joint swelling and neck pain.  Skin: Negative for rash.  Allergic/Immunologic: Negative for environmental allergies.  Neurological: Negative for tremors and numbness.  Hematological: Negative for adenopathy. Does not bruise/bleed easily.  Psychiatric/Behavioral: Negative for  behavioral problems (Depression), sleep disturbance and suicidal ideas. The patient is nervous/anxious.     Today's Vitals   03/20/19 1547  BP: 136/80  Pulse: 80  Resp: 16  Temp: 97.8 F (36.6 C)  SpO2: 96%  Weight: 166 lb 3.2 oz (75.4 kg)  Height: 5\' 3"  (1.6 m)   Body mass index is 29.44 kg/m.  Physical Exam Vitals and nursing note reviewed.  Constitutional:      General: She is not in acute distress.    Appearance: Normal appearance. She is well-developed. She is not diaphoretic.  HENT:     Head: Normocephalic and atraumatic.     Nose: Nose normal.     Mouth/Throat:     Pharynx: No oropharyngeal exudate.  Eyes:  Pupils: Pupils are equal, round, and reactive to light.  Neck:     Thyroid: No thyromegaly.     Vascular: No carotid bruit or JVD.     Trachea: No tracheal deviation.  Cardiovascular:     Rate and Rhythm: Normal rate and regular rhythm.     Heart sounds: Normal heart sounds. No murmur. No friction rub. No gallop.   Pulmonary:     Effort: Pulmonary effort is normal. No respiratory distress.     Breath sounds: Normal breath sounds. No wheezing or rales.  Chest:     Chest wall: No tenderness.  Abdominal:     Palpations: Abdomen is soft.  Musculoskeletal:        General: Normal range of motion.     Cervical back: Normal range of motion and neck supple.  Lymphadenopathy:     Cervical: No cervical adenopathy.  Skin:    General: Skin is warm and dry.  Neurological:     Mental Status: She is alert and oriented to person, place, and time.     Cranial Nerves: No cranial nerve deficit.  Psychiatric:        Mood and Affect: Mood normal.        Behavior: Behavior normal.        Thought Content: Thought content normal.        Judgment: Judgment normal.   Assessment/Plan:  1. Essential (primary) hypertension Stable. Continue bp medication as prescribed  - amLODipine (NORVASC) 10 MG tablet; Take 1 tablet (10 mg total) by mouth daily.  Dispense: 90 tablet;  Refill: 2  2. Overweight May restart phentermine 37.5mg  tablets daily. Limit calorie intake to 1200 calories per day and gradually incorporate exercise into daily routine.  - phentermine (ADIPEX-P) 37.5 MG tablet; Take 1 tablet (37.5 mg total) by mouth daily before breakfast.  Dispense: 30 tablet; Refill: 1  3. GAD (generalized anxiety disorder) May take alprazolam 0.25mg  at bedtime as needed.   General Counseling: verbal holsten understanding of the findings of todays visit and agrees with plan of treatment. I have discussed any further diagnostic evaluation that may be needed or ordered today. We also reviewed her medications today. she has been encouraged to call the office with any questions or concerns that should arise related to todays visit.   There is a liability release in patients' chart. There has been a 10 minute discussion about the side effects including but not limited to elevated blood pressure, anxiety, lack of sleep and dry mouth. Pt understands and will like to start/continue on appetite suppressant at this time. There will be one month RX given at the time of visit with proper follow up. Nova diet plan with restricted calories is given to the pt. Pt understands and agrees with  plan of treatment  This patient was seen by Leretha Pol FNP Collaboration with Dr Lavera Guise as a part of collaborative care agreement  Meds ordered this encounter  Medications  . amLODipine (NORVASC) 10 MG tablet    Sig: Take 1 tablet (10 mg total) by mouth daily.    Dispense:  90 tablet    Refill:  2    Order Specific Question:   Supervising Provider    Answer:   Lavera Guise [id number]  . phentermine (ADIPEX-P) 37.5 MG tablet    Sig: Take 1 tablet (37.5 mg total) by mouth daily before breakfast.    Dispense:  30 tablet    Refill:  1    Order  Specific Question:   Supervising Provider    Answer:   Lavera Guise [id number]    Time spent: 49 Minutes      Dr Lavera Guise Internal  medicine

## 2019-04-30 ENCOUNTER — Telehealth: Payer: Self-pay

## 2019-04-30 NOTE — Telephone Encounter (Signed)
CONFIRMED 05-04-19 OV AS VIRTUAL °

## 2019-04-30 NOTE — Telephone Encounter (Signed)
lmom for patient to confirm 05-04-19 ov as virtual.

## 2019-05-04 ENCOUNTER — Ambulatory Visit: Payer: BC Managed Care – PPO | Admitting: Nurse Practitioner

## 2019-05-21 ENCOUNTER — Telehealth: Payer: Self-pay

## 2019-05-21 NOTE — Telephone Encounter (Signed)
LMOM FOR PATIENT TO SCREEN AND CONFIRM FOR 05-25-19 OV.

## 2019-05-21 NOTE — Telephone Encounter (Signed)
Confirmed and screened for 05-25-19 ov.

## 2019-05-25 ENCOUNTER — Ambulatory Visit (INDEPENDENT_AMBULATORY_CARE_PROVIDER_SITE_OTHER): Payer: PRIVATE HEALTH INSURANCE | Admitting: Nurse Practitioner

## 2019-05-25 ENCOUNTER — Encounter: Payer: Self-pay | Admitting: Nurse Practitioner

## 2019-05-25 ENCOUNTER — Other Ambulatory Visit: Payer: Self-pay

## 2019-05-25 VITALS — BP 135/94 | HR 77 | Temp 97.1°F | Resp 16 | Ht 63.0 in | Wt 165.4 lb

## 2019-05-25 DIAGNOSIS — Z79899 Other long term (current) drug therapy: Secondary | ICD-10-CM | POA: Diagnosis not present

## 2019-05-25 DIAGNOSIS — Z1231 Encounter for screening mammogram for malignant neoplasm of breast: Secondary | ICD-10-CM

## 2019-05-25 DIAGNOSIS — Z0001 Encounter for general adult medical examination with abnormal findings: Secondary | ICD-10-CM | POA: Diagnosis not present

## 2019-05-25 DIAGNOSIS — F411 Generalized anxiety disorder: Secondary | ICD-10-CM

## 2019-05-25 DIAGNOSIS — I10 Essential (primary) hypertension: Secondary | ICD-10-CM | POA: Diagnosis not present

## 2019-05-25 DIAGNOSIS — E663 Overweight: Secondary | ICD-10-CM

## 2019-05-25 DIAGNOSIS — R300 Dysuria: Secondary | ICD-10-CM

## 2019-05-25 MED ORDER — ALPRAZOLAM 0.25 MG PO TABS: 0.2500 mg | ORAL_TABLET | Freq: Every evening | ORAL | 3 refills | Status: DC | PRN

## 2019-05-25 MED ORDER — PHENTERMINE HCL 37.5 MG PO TABS: 37.5000 mg | ORAL_TABLET | Freq: Every day | ORAL | 1 refills | Status: DC

## 2019-05-25 NOTE — Progress Notes (Signed)
[facility] [place], [place] [address]  Internal MEDICINE  Office Visit Note  Patient Name: Patricia Frazier  [id number]  [id number]  Date of Service: 05/29/2019   Pt is here for routine health maintenance examination  Chief Complaint  Patient presents with  . Annual Exam  . Hypertension  . Gastroesophageal Reflux  . Depression  . Anxiety     The patient is here for health maintenance exam. Blood pressure is well managed. She continues to have low back pain and sciatic nerve pain. She is seeing pain management provider. Is weaning up her dose of gabapentin to help quiet the nerves. She is also doing physical therapy. Her blood pressure is well controlled. She recently had routine, fasting labs drawn. Her LDL and total cholesterol were very mildly elevated. All other labs were normal. She is due to have screening mammogram. She hs been taking phentermine off and on to help her control appetite and maintain her weight. She has lost 1 pound since her last visit. She would like to continue this if possible.     Current Medication: Outpatient Encounter Medications as of 05/25/2019  Medication Sig  . ALPRAZolam (XANAX) 0.25 MG tablet Take 1 tablet (0.25 mg total) by mouth at bedtime as needed for anxiety.  Marland Kitchen amLODipine (NORVASC) 10 MG tablet Take 1 tablet (10 mg total) by mouth daily.  Marland Kitchen gabapentin (NEURONTIN) 100 MG capsule Take 100 mg by mouth as needed.  Marland Kitchen ibuprofen (ADVIL) 200 MG tablet Take 400 mg by mouth every 6 (six) hours as needed.  . methocarbamol (ROBAXIN) 500 MG tablet Take 1 tablet (500 mg total) by mouth every 6 (six) hours as needed for muscle spasms.  . Multiple Vitamin (MULTIVITAMIN) tablet Take 1 tablet by mouth daily. chewable  . omeprazole (PRILOSEC OTC) 20 MG tablet Take 1 tablet (20 mg total) by mouth daily.  . phentermine (ADIPEX-P) 37.5 MG tablet Take 1 tablet (37.5 mg total) by mouth daily before breakfast.  . pravastatin  (PRAVACHOL) 40 MG tablet TAKE 1 TABLET(40 MG) BY MOUTH AT BEDTIME  . [DISCONTINUED] ALPRAZolam (XANAX) 0.25 MG tablet Take 1 tablet (0.25 mg total) by mouth at bedtime as needed for anxiety.  . [DISCONTINUED] phentermine (ADIPEX-P) 37.5 MG tablet Take 1 tablet (37.5 mg total) by mouth daily before breakfast.   No facility-administered encounter medications on file as of 05/25/2019.    Surgical History: Past Surgical History:  Procedure Laterality Date  . BREAST CYST ASPIRATION Left yrs ago  . LUMBAR LAMINECTOMY/DECOMPRESSION MICRODISCECTOMY Left 06/18/2018   Procedure: LUMBAR LAMINECTOMY/DECOMPRESSION MICRODISCECTOMY 1 LEVEL, L5-S1  LEFT;  Surgeon: Meade Maw, MD;  Location: [facility] ORS;  Service: Neurosurgery;  Laterality: Left;  . STENT PLACE LEFT URETER ([place] HX)     unsure  of side  . TONSILLECTOMY    . tubiligation      Medical History: Past Medical History:  Diagnosis Date  . Anxiety   . Back pain   . Bulging lumbar disc   . Chest pain   . Depression   . GERD (gastroesophageal reflux disease)   . History of kidney stones   . Hypertension   . Migraine   . Sleep apnea    has never used a cpap machine  . Varicose veins of legs   . Vertigo   . Vision problems     Family History: Family History  Problem Relation Age of Onset  . Hypertension Mother   . Cancer Father   .  Hypertension Father   . Hyperlipidemia Father       Review of Systems  Constitutional: Positive for activity change. Negative for chills, fatigue and unexpected weight change.       Limited activity level due to spinal surgery 06/18/2018.she has been able to lose one pound since her last visit.   HENT: Negative for congestion, postnasal drip, rhinorrhea, sneezing and sore throat.   Respiratory: Negative for cough, chest tightness and shortness of breath.   Cardiovascular: Negative for chest pain, palpitations and leg swelling.       Blood pressure mildly elevated.   Gastrointestinal: Negative  for abdominal pain, constipation, diarrhea, nausea and vomiting.  Endocrine: Negative for cold intolerance, heat intolerance, polydipsia and polyuria.  Genitourinary: Negative for dysuria, frequency and urgency.  Musculoskeletal: Positive for back pain and myalgias. Negative for arthralgias, joint swelling and neck pain.  Skin: Negative for rash.  Allergic/Immunologic: Negative for environmental allergies.  Neurological: Negative for dizziness, tremors, numbness and headaches.  Hematological: Negative for adenopathy. Does not bruise/bleed easily.  Psychiatric/Behavioral: Negative for behavioral problems (Depression), sleep disturbance and suicidal ideas. The patient is nervous/anxious.      Today's Vitals   05/25/19 1516  BP: (!) 135/94  Pulse: 77  Resp: 16  Temp: (!) 97.1 F (36.2 C)  SpO2: 96%  Weight: 165 lb 6.4 oz (75 kg)  Height: 5\' 3"  (1.6 m)   Body mass index is 29.3 kg/m.  Physical Exam Vitals and nursing note reviewed.  Constitutional:      General: She is not in acute distress.    Appearance: Normal appearance. She is well-developed. She is not diaphoretic.  HENT:     Head: Normocephalic and atraumatic.     Nose: Nose normal.     Mouth/Throat:     Pharynx: No oropharyngeal exudate.  Eyes:     Conjunctiva/sclera: Conjunctivae normal.     Pupils: Pupils are equal, round, and reactive to light.  Neck:     Thyroid: No thyromegaly.     Vascular: No carotid bruit or JVD.     Trachea: No tracheal deviation.  Cardiovascular:     Rate and Rhythm: Normal rate and regular rhythm.     Pulses: Normal pulses.     Heart sounds: Normal heart sounds. No murmur. No friction rub. No gallop.   Pulmonary:     Effort: Pulmonary effort is normal. No respiratory distress.     Breath sounds: Normal breath sounds. No wheezing or rales.  Chest:     Chest wall: No tenderness.     Breasts:        Right: Normal. No swelling, bleeding, inverted nipple, mass, nipple discharge, skin  change or tenderness.        Left: Normal. No swelling, bleeding, inverted nipple, mass, nipple discharge, skin change or tenderness.  Abdominal:     General: Bowel sounds are normal.     Palpations: Abdomen is soft.     Tenderness: There is no abdominal tenderness.  Musculoskeletal:        General: Normal range of motion.     Cervical back: Normal range of motion and neck supple.     Comments: Moderate lower back pain, worse after sitting still for long periods of time.  More painful when changing position. Grimacing noted with position changes.  Lymphadenopathy:     Cervical: No cervical adenopathy.     Upper Body:     Right upper body: No axillary adenopathy.     Left  upper body: No axillary adenopathy.  Skin:    General: Skin is warm and dry.     Capillary Refill: Capillary refill takes less than 2 seconds.  Neurological:     General: No focal deficit present.     Mental Status: She is alert and oriented to person, place, and time.     Cranial Nerves: No cranial nerve deficit.  Psychiatric:        Mood and Affect: Mood normal.        Behavior: Behavior normal.        Thought Content: Thought content normal.        Judgment: Judgment normal.      LABS: Recent Results (from the past 2160 hour(s))  UA/M w/rflx Culture, Routine     Status: Abnormal (Preliminary result)   Collection Time: 05/25/19 12:00 AM   Specimen: Urine   URINE  Result Value Ref Range   Specific Gravity, UA 1.020 1.005 - 1.030   pH, UA 5.0 5.0 - 7.5   Color, UA Yellow Yellow   Appearance Ur Clear Clear   Leukocytes,UA 1+ (A) Negative   Protein,UA Negative Negative/Trace   Glucose, UA Negative Negative   Ketones, UA Negative Negative   RBC, UA Negative Negative   Bilirubin, UA Negative Negative   Urobilinogen, Ur 0.2 0.2 - 1.0 mg/dL   Nitrite, UA Negative Negative   Microscopic Examination See below:     Comment: Microscopic was indicated and was performed.   Urinalysis Reflex Comment      Comment: This specimen has reflexed to a Urine Culture.  Microscopic Examination     Status: Abnormal   Collection Time: 05/25/19 12:00 AM   URINE  Result Value Ref Range   WBC, UA 6-10 (A) 0 - 5 /hpf   RBC 0-2 0 - 2 /hpf   Epithelial Cells (non renal) 0-10 0 - 10 /hpf   Casts None seen None seen /lpf   Mucus, UA Present Not Estab.   Bacteria, UA Few None seen/Few  Urine Culture, Reflex     Status: None (Preliminary result)   Collection Time: 05/25/19 12:00 AM   URINE  Result Value Ref Range   Urine Culture, Routine Preliminary report    Organism ID, Bacteria Comment     Comment: Microbiological testing to rule out the presence of possible pathogens is in progress.   Comprehensive metabolic panel     Status: None   Collection Time: 05/26/19  1:12 PM  Result Value Ref Range   Glucose 93 65 - 99 mg/dL   BUN 18 8 - 27 mg/dL   Creatinine, Ser 0.66 0.57 - 1.00 mg/dL   GFR calc non Af Amer 90 >59 mL/min/1.73   GFR calc Af Amer 104 >59 mL/min/1.73   BUN/Creatinine Ratio 27 12 - 28   Sodium 139 134 - 144 mmol/L   Potassium 4.3 3.5 - 5.2 mmol/L   Chloride 99 96 - 106 mmol/L   CO2 26 20 - 29 mmol/L   Calcium 9.9 8.7 - 10.3 mg/dL   Total Protein 6.8 6.0 - 8.5 g/dL   Albumin 4.5 3.8 - 4.8 g/dL   Globulin, Total 2.3 1.5 - 4.5 g/dL   Albumin/Globulin Ratio 2.0 1.2 - 2.2   Bilirubin Total 0.4 0.0 - 1.2 mg/dL   Alkaline Phosphatase 93 39 - 117 IU/L   AST 19 0 - 40 IU/L   ALT 17 0 - 32 IU/L  CBC     Status: None  Collection Time: 05/26/19  1:12 PM  Result Value Ref Range   WBC 6.2 3.4 - 10.8 x10E3/uL   RBC 4.58 3.77 - 5.28 x10E6/uL   Hemoglobin 14.0 11.1 - 15.9 g/dL   Hematocrit 40.4 34.0 - 46.6 %   MCV 88 79 - 97 fL   MCH 30.6 26.6 - 33.0 pg   MCHC 34.7 31.5 - 35.7 g/dL   RDW 13.9 11.7 - 15.4 %   Platelets 269 150 - 450 x10E3/uL  Lipid Panel w/o Chol/HDL Ratio     Status: Abnormal   Collection Time: 05/26/19  1:12 PM  Result Value Ref Range   Cholesterol, Total 202 (H) 100 -  199 mg/dL   Triglycerides 86 0 - 149 mg/dL   HDL 79 >39 mg/dL   VLDL Cholesterol Cal 15 5 - 40 mg/dL   LDL Chol Calc (NIH) 108 (H) 0 - 99 mg/dL  T4, free     Status: None   Collection Time: 05/26/19  1:12 PM  Result Value Ref Range   Free T4 1.33 0.82 - 1.77 ng/dL  TSH     Status: None   Collection Time: 05/26/19  1:12 PM  Result Value Ref Range   TSH 0.814 0.450 - 4.500 uIU/mL  VITAMIN D 25 Hydroxy (Vit-D Deficiency, Fractures)     Status: None   Collection Time: 05/26/19  1:12 PM  Result Value Ref Range   Vit D, 25-Hydroxy 31.2 30.0 - 100.0 ng/mL    Comment: Vitamin D deficiency has been defined by the Center and an Endocrine Society practice guideline as a level of serum 25-OH vitamin D less than 20 ng/mL (1,2). The Endocrine Society went on to further define vitamin D insufficiency as a level between 21 and 29 ng/mL (2). 1. IOM (Institute of Medicine). 2010. Dietary reference    intakes for calcium and D. [place]: The    [employer]. 2. Holick MF, Binkley Mahaffey, Bischoff-Ferrari HA, et al.    Evaluation, treatment, and prevention of vitamin D    deficiency: an Endocrine Society clinical practice    guideline. JCEM. 2011 Jul; 96(7):1911-30.   Hepatitis c antibody (reflex)     Status: None   Collection Time: 05/26/19  1:12 PM  Result Value Ref Range   HCV Ab <0.1 0.0 - 0.9 s/co ratio  HCV Comment:     Status: None   Collection Time: 05/26/19  1:12 PM  Result Value Ref Range   Comment: Comment     Comment: Non reactive HCV antibody screen is consistent with no HCV infection, unless recent infection is suspected or other evidence exists to indicate HCV infection.    Assessment/Plan:  1. Encounter for long-term (current) use of medications Annual health maintenance exam today  2. Essential (primary) hypertension Stable. Continue norvasc 10mg  daily  3. Overweight Improving. conitnue phentermine daily. Limit calorie intake to 1200-1500  calories per day. Gradually increase low-impact exercise.  - phentermine (ADIPEX-P) 37.5 MG tablet; Take 1 tablet (37.5 mg total) by mouth daily before breakfast.  Dispense: 30 tablet; Refill: 1  4. GAD (generalized anxiety disorder) May take alprazolam 0.25mg  at bedtime as needed for anxiety/insomnia.  - ALPRAZolam (XANAX) 0.25 MG tablet; Take 1 tablet (0.25 mg total) by mouth at bedtime as needed for anxiety.  Dispense: 30 tablet; Refill: 3  5. Encounter for screening mammogram for malignant neoplasm of breast - MM 3D SCREEN BREAST BILATERAL; Future  6. Dysuria - UA/M w/rflx Culture, Routine  General Counseling: tawnya pierro understanding of the findings of todays visit and agrees with plan of treatment. I have discussed any further diagnostic evaluation that may be needed or ordered today. We also reviewed her medications today. she has been encouraged to call the office with any questions or concerns that should arise related to todays visit.    Counseling:   There is a liability release in patients' chart. There has been a 10 minute discussion about the side effects including but not limited to elevated blood pressure, anxiety, lack of sleep and dry mouth. Pt understands and will like to start/continue on appetite suppressant at this time. There will be one month RX given at the time of visit with proper follow up. Nova diet plan with restricted calories is given to the pt. Pt understands and agrees with  plan of treatment  This patient was seen by Leretha Pol FNP Collaboration with Dr Lavera Guise as a part of collaborative care agreement  Orders Placed This Encounter  Procedures  . Microscopic Examination  . Urine Culture, Reflex  . MM 3D SCREEN BREAST BILATERAL  . UA/M w/rflx Culture, Routine    Meds ordered this encounter  Medications  . ALPRAZolam (XANAX) 0.25 MG tablet    Sig: Take 1 tablet (0.25 mg total) by mouth at bedtime as needed for anxiety.     Dispense:  30 tablet    Refill:  3    Order Specific Question:   Supervising Provider    Answer:   Lavera Guise [id number]  . phentermine (ADIPEX-P) 37.5 MG tablet    Sig: Take 1 tablet (37.5 mg total) by mouth daily before breakfast.    Dispense:  30 tablet    Refill:  1    Order Specific Question:   Supervising Provider    Answer:   Lavera Guise [id number]    Total time spent: 75 Minutes  Time spent includes review of chart, medications, test results, and follow up plan with the patient.     Lavera Guise, MD  Internal Medicine

## 2019-05-26 ENCOUNTER — Other Ambulatory Visit: Payer: Self-pay | Admitting: Nurse Practitioner

## 2019-05-27 LAB — COMPREHENSIVE METABOLIC PANEL
ALT: 17 IU/L (ref 0–32)
AST: 19 IU/L (ref 0–40)
Albumin/Globulin Ratio: 2 (ref 1.2–2.2)
Albumin: 4.5 g/dL (ref 3.8–4.8)
Alkaline Phosphatase: 93 IU/L (ref 39–117)
BUN/Creatinine Ratio: 27 (ref 12–28)
BUN: 18 mg/dL (ref 8–27)
Bilirubin Total: 0.4 mg/dL (ref 0.0–1.2)
CO2: 26 mmol/L (ref 20–29)
Calcium: 9.9 mg/dL (ref 8.7–10.3)
Chloride: 99 mmol/L (ref 96–106)
Creatinine, Ser: 0.66 mg/dL (ref 0.57–1.00)
GFR calc Af Amer: 104 mL/min/{1.73_m2} (ref >59)
GFR calc non Af Amer: 90 mL/min/{1.73_m2} (ref >59)
Globulin, Total: 2.3 g/dL (ref 1.5–4.5)
Glucose: 93 mg/dL (ref 65–99)
Potassium: 4.3 mmol/L (ref 3.5–5.2)
Sodium: 139 mmol/L (ref 134–144)
Total Protein: 6.8 g/dL (ref 6.0–8.5)

## 2019-05-27 LAB — CBC
Hematocrit: 40.4 % (ref 34.0–46.6)
Hemoglobin: 14 g/dL (ref 11.1–15.9)
MCH: 30.6 pg (ref 26.6–33.0)
MCHC: 34.7 g/dL (ref 31.5–35.7)
MCV: 88 fL (ref 79–97)
Platelets: 269 10*3/uL (ref 150–450)
RBC: 4.58 x10E6/uL (ref 3.77–5.28)
RDW: 13.9 % (ref 11.7–15.4)
WBC: 6.2 10*3/uL (ref 3.4–10.8)

## 2019-05-27 LAB — LIPID PANEL W/O CHOL/HDL RATIO
Cholesterol, Total: 202 mg/dL — ABNORMAL HIGH (ref 100–199)
HDL: 79 mg/dL (ref >39)
LDL Chol Calc (NIH): 108 mg/dL — ABNORMAL HIGH (ref 0–99)
Triglycerides: 86 mg/dL (ref 0–149)
VLDL Cholesterol Cal: 15 mg/dL (ref 5–40)

## 2019-05-27 LAB — VITAMIN D 25 HYDROXY (VIT D DEFICIENCY, FRACTURES): Vit D, 25-Hydroxy: 31.2 ng/mL (ref 30.0–100.0)

## 2019-05-27 LAB — TSH: TSH: 0.814 u[IU]/mL (ref 0.450–4.500)

## 2019-05-27 LAB — T4, FREE: Free T4: 1.33 ng/dL (ref 0.82–1.77)

## 2019-05-27 LAB — HEPATITIS C ANTIBODY (REFLEX): HCV Ab: <0.1 s/co ratio (ref 0.0–0.9)

## 2019-05-27 LAB — HCV COMMENT:

## 2019-05-27 NOTE — Progress Notes (Signed)
Waiting for results of culture and sensitivity

## 2019-05-29 DIAGNOSIS — Z1231 Encounter for screening mammogram for malignant neoplasm of breast: Secondary | ICD-10-CM | POA: Insufficient documentation

## 2019-05-29 LAB — UA/M W/RFLX CULTURE, ROUTINE
Bilirubin, UA: NEGATIVE
Glucose, UA: NEGATIVE
Ketones, UA: NEGATIVE
Nitrite, UA: NEGATIVE
Protein,UA: NEGATIVE
RBC, UA: NEGATIVE
Specific Gravity, UA: 1.02 (ref 1.005–1.030)
Urobilinogen, Ur: 0.2 mg/dL (ref 0.2–1.0)
pH, UA: 5 (ref 5.0–7.5)

## 2019-05-29 LAB — URINE CULTURE, REFLEX

## 2019-05-29 LAB — MICROSCOPIC EXAMINATION: Casts: NONE SEEN /lpf

## 2019-05-31 NOTE — Progress Notes (Signed)
Mixed urogenital flora. Patient without evidence or symptoms of infection. Will monitor.

## 2019-07-01 ENCOUNTER — Other Ambulatory Visit: Payer: Self-pay | Admitting: Internal Medicine

## 2019-07-01 DIAGNOSIS — E782 Mixed hyperlipidemia: Secondary | ICD-10-CM

## 2019-07-21 ENCOUNTER — Ambulatory Visit
Admission: RE | Admit: 2019-07-21 | Discharge: 2019-07-21 | Disposition: A | Discharge status: Home / Self Care | Payer: PRIVATE HEALTH INSURANCE | Source: Ambulatory Visit | Attending: Nurse Practitioner | Admitting: Nurse Practitioner

## 2019-07-21 DIAGNOSIS — Z1231 Encounter for screening mammogram for malignant neoplasm of breast: Secondary | ICD-10-CM | POA: Diagnosis not present

## 2019-07-22 ENCOUNTER — Telehealth: Payer: Self-pay

## 2019-07-22 ENCOUNTER — Other Ambulatory Visit: Payer: Self-pay | Admitting: Nurse Practitioner

## 2019-07-22 DIAGNOSIS — N6489 Other specified disorders of breast: Secondary | ICD-10-CM

## 2019-07-22 DIAGNOSIS — R928 Other abnormal and inconclusive findings on diagnostic imaging of breast: Secondary | ICD-10-CM

## 2019-07-22 NOTE — Progress Notes (Signed)
Additional images recommended.

## 2019-07-22 NOTE — Telephone Encounter (Signed)
lmom to confirm and screen for 07-24-19 ov.

## 2019-07-24 ENCOUNTER — Other Ambulatory Visit: Payer: Self-pay

## 2019-07-24 ENCOUNTER — Encounter: Payer: Self-pay | Admitting: Nurse Practitioner

## 2019-07-24 ENCOUNTER — Ambulatory Visit: Payer: PRIVATE HEALTH INSURANCE | Admitting: Nurse Practitioner

## 2019-07-24 VITALS — BP 139/79 | HR 90 | Temp 97.5°F | Resp 16 | Ht 63.0 in | Wt 164.6 lb

## 2019-07-24 DIAGNOSIS — E782 Mixed hyperlipidemia: Secondary | ICD-10-CM

## 2019-07-24 DIAGNOSIS — I10 Essential (primary) hypertension: Secondary | ICD-10-CM | POA: Diagnosis not present

## 2019-07-24 DIAGNOSIS — R928 Other abnormal and inconclusive findings on diagnostic imaging of breast: Secondary | ICD-10-CM | POA: Diagnosis not present

## 2019-07-24 DIAGNOSIS — E663 Overweight: Secondary | ICD-10-CM

## 2019-07-24 MED ORDER — PHENTERMINE HCL 37.5 MG PO TABS: 37.5000 mg | ORAL_TABLET | Freq: Every day | ORAL | 1 refills | Status: DC

## 2019-07-24 MED ORDER — PRAVASTATIN SODIUM 40 MG PO TABS: 40.0000 mg | ORAL_TABLET | Freq: Every day | ORAL | 3 refills | Status: DC

## 2019-07-24 NOTE — Progress Notes (Signed)
[facility] [place], [place] [address]  Internal MEDICINE  Office Visit Note  Patient Name: Patricia Frazier  [id number]  [id number]  Date of Service: 08/05/2019  Chief Complaint  Patient presents with  . Hypertension  . Hyperlipidemia  . Gastroesophageal Reflux    The patient is here for routine follow up. She takes phentermine off an d on to help with weight management. She has lost a little more than one pounds since she was last seen. She is carefully monitoring her diet, but has difficult time exercising due ot severe lower back pain. She continues to see physical therapy for this. Gradually adding increased exercises. Blood pressure is well controlled with current medication. She did have screening mammogram done. Breast center has asked that she have some additional, diagnostic images of her left breast.       Current Medication: Outpatient Encounter Medications as of 07/24/2019  Medication Sig  . ALPRAZolam (XANAX) 0.25 MG tablet Take 1 tablet (0.25 mg total) by mouth at bedtime as needed for anxiety.  Marland Kitchen amLODipine (NORVASC) 10 MG tablet Take 1 tablet (10 mg total) by mouth daily.  Marland Kitchen gabapentin (NEURONTIN) 100 MG capsule Take 100 mg by mouth as needed.  Marland Kitchen ibuprofen (ADVIL) 200 MG tablet Take 400 mg by mouth every 6 (six) hours as needed.  . methocarbamol (ROBAXIN) 500 MG tablet Take 1 tablet (500 mg total) by mouth every 6 (six) hours as needed for muscle spasms.  . Multiple Vitamin (MULTIVITAMIN) tablet Take 1 tablet by mouth daily. chewable  . omeprazole (PRILOSEC OTC) 20 MG tablet Take 1 tablet (20 mg total) by mouth daily.  . phentermine (ADIPEX-P) 37.5 MG tablet Take 1 tablet (37.5 mg total) by mouth daily before breakfast.  . pravastatin (PRAVACHOL) 40 MG tablet Take 1 tablet (40 mg total) by mouth at bedtime.  . [DISCONTINUED] phentermine (ADIPEX-P) 37.5 MG tablet Take 1 tablet (37.5 mg total) by mouth daily before breakfast.  .  [DISCONTINUED] pravastatin (PRAVACHOL) 40 MG tablet TAKE 1 TABLET BY MOUTH AT BEDTIME   No facility-administered encounter medications on file as of 07/24/2019.    Surgical History: Past Surgical History:  Procedure Laterality Date  . BREAST CYST ASPIRATION Left yrs ago  . LUMBAR LAMINECTOMY/DECOMPRESSION MICRODISCECTOMY Left 06/18/2018   Procedure: LUMBAR LAMINECTOMY/DECOMPRESSION MICRODISCECTOMY 1 LEVEL, L5-S1  LEFT;  Surgeon: Meade Maw, MD;  Location: [facility] ORS;  Service: Neurosurgery;  Laterality: Left;  . STENT PLACE LEFT URETER ([place] HX)     unsure  of side  . TONSILLECTOMY    . tubiligation      Medical History: Past Medical History:  Diagnosis Date  . Anxiety   . Back pain   . Bulging lumbar disc   . Chest pain   . Depression   . GERD (gastroesophageal reflux disease)   . History of kidney stones   . Hypertension   . Migraine   . Sleep apnea    has never used a cpap machine  . Varicose veins of legs   . Vertigo   . Vision problems     Family History: Family History  Problem Relation Age of Onset  . Hypertension Mother   . Cancer Father   . Hypertension Father   . Hyperlipidemia Father     Social History   Socioeconomic History  . Marital status: Married    Spouse name: Merry Proud  . Number of children: Not on file  . Years of education: Not on file  .  Highest education level: Not on file  Occupational History  . 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 Smoking status: Former Smoker    Types: Cigarettes    Quit date: 2000    Years since quitting: 21.3  . Smokeless tobacco: Never Used  Substance and Sexual Activity  . Alcohol use: Yes    Comment: socially  . Drug use: No  . Sexual activity: Not Currently  Other Topics Concern  . Not on file  Social History Narrative  . Not on file   Social Determinants of Health   Financial Resource Strain:   . Difficulty of Paying Living Expenses:   Food Insecurity:    . Worried About Charity fundraiser in the Last Year:   . Arboriculturist in the Last Year:   Transportation Needs:   . Film/video editor (Medical):   Marland Kitchen Lack of Transportation (Non-Medical):   Physical Activity:   . Days of Exercise per Week:   . Minutes of Exercise per Session:   Stress:   . Feeling of Stress :   Social Connections:   . Frequency of Communication with Friends and Family:   . Frequency of Social Gatherings with Friends and Family:   . Attends Religious Services:   . Active Member of Clubs or Organizations:   . Attends Archivist Meetings:   Marland Kitchen Marital Status:   Intimate Partner Violence:   . Fear of Current or Ex-Partner:   . Emotionally Abused:   Marland Kitchen Physically Abused:   . Sexually Abused:       Review of Systems  Constitutional: Positive for activity change. Negative for chills, fatigue and unexpected weight change.       Limited activity level due to spinal surgery 06/18/2018.she has been able to lose one pound since her last visit.   HENT: Negative for congestion, postnasal drip, rhinorrhea, sneezing and sore throat.   Respiratory: Negative for cough, chest tightness and shortness of breath.   Cardiovascular: Negative for chest pain, palpitations and leg swelling.       Blood pressure mildly elevated.   Gastrointestinal: Negative for abdominal pain, constipation, diarrhea, nausea and vomiting.  Endocrine: Negative for cold intolerance, heat intolerance, polydipsia and polyuria.  Genitourinary: Negative for dysuria, frequency and urgency.  Musculoskeletal: Positive for back pain and myalgias. Negative for arthralgias, joint swelling and neck pain.  Skin: Negative for rash.  Allergic/Immunologic: Negative for environmental allergies.  Neurological: Negative for dizziness, tremors, numbness and headaches.  Hematological: Negative for adenopathy. Does not bruise/bleed easily.  Psychiatric/Behavioral: Negative for behavioral problems (Depression),  sleep disturbance and suicidal ideas. The patient is nervous/anxious.     Today's Vitals   07/24/19 1544  BP: 139/79  Pulse: 90  Resp: 16  Temp: (!) 97.5 F (36.4 C)  SpO2: 96%  Weight: 164 lb 9.6 oz (74.7 kg)  Height: 5\' 3"  (1.6 m)   Body mass index is 29.16 kg/m.  Physical Exam Vitals and nursing note reviewed.  Constitutional:      General: She is not in acute distress.    Appearance: Normal appearance. She is well-developed. She is not diaphoretic.  HENT:     Head: Normocephalic and atraumatic.     Mouth/Throat:     Pharynx: No oropharyngeal exudate.  Eyes:     Pupils: Pupils are equal, round, and reactive to light.  Neck:     Thyroid: No thyromegaly.  Vascular: No carotid bruit or JVD.     Trachea: No tracheal deviation.  Cardiovascular:     Rate and Rhythm: Normal rate and regular rhythm.     Heart sounds: Normal heart sounds. No murmur. No friction rub. No gallop.   Pulmonary:     Effort: Pulmonary effort is normal. No respiratory distress.     Breath sounds: Normal breath sounds. No wheezing or rales.  Chest:     Chest wall: No tenderness.  Abdominal:     General: Bowel sounds are normal.     Palpations: Abdomen is soft.  Musculoskeletal:        General: Normal range of motion.     Cervical back: Normal range of motion and neck supple.     Comments:  Moderate lower back pain, worse after sitting still for long periods of time.  More painful when changing position. Grimacing noted with position changes.    Lymphadenopathy:     Cervical: No cervical adenopathy.  Skin:    General: Skin is warm and dry.  Neurological:     Mental Status: She is alert and oriented to person, place, and time.     Cranial Nerves: No cranial nerve deficit.  Psychiatric:        Behavior: Behavior normal.        Thought Content: Thought content normal.        Judgment: Judgment normal.   Assessment/Plan:  1. Essential (primary) hypertension Stable. Continue BP medication  as prescribed  2. Mixed hyperlipidemia Lipid panel stable. Continue pravastatin as prescribed  - pravastatin (PRAVACHOL) 40 MG tablet; Take 1 tablet (40 mg total) by mouth at bedtime.  Dispense: 90 tablet; Refill: 3  3. Overweight Improving. May continue adipex daily. Limit calorie intake to 1200 calories per day and incoprorate low-impact exercise to daily routine.  - phentermine (ADIPEX-P) 37.5 MG tablet; Take 1 tablet (37.5 mg total) by mouth daily before breakfast.  Dispense: 30 tablet; Refill: 1  4. Abnormal mammogram of right breast Diagnostic images of right breast ordered for further evaluation.  - MM Digital Diagnostic Unilat R; Future  General Counseling: jaidalyn hendler understanding of the findings of todays visit and agrees with plan of treatment. I have discussed any further diagnostic evaluation that may be needed or ordered today. We also reviewed her medications today. she has been encouraged to call the office with any questions or concerns that should arise related to todays visit.  This patient was seen by Leretha Pol FNP Collaboration with Dr Lavera Guise as a part of collaborative care agreement  Orders Placed This Encounter  Procedures  . MM Digital Diagnostic Unilat R    Meds ordered this encounter  Medications  . pravastatin (PRAVACHOL) 40 MG tablet    Sig: Take 1 tablet (40 mg total) by mouth at bedtime.    Dispense:  90 tablet    Refill:  3    Please fill as 90 day prescription with next fill.    Order Specific Question:   Supervising Provider    Answer:   Lavera Guise [id number]  . phentermine (ADIPEX-P) 37.5 MG tablet    Sig: Take 1 tablet (37.5 mg total) by mouth daily before breakfast.    Dispense:  30 tablet    Refill:  1    Order Specific Question:   Supervising Provider    Answer:   Lavera Guise [id number]    Total time spent: 30 Minutes   Time spent includes  review of chart, medications, test results, and follow up plan with the patient.       Dr Lavera Guise Internal medicine

## 2019-07-28 ENCOUNTER — Telehealth: Payer: Self-pay

## 2019-07-28 NOTE — Telephone Encounter (Signed)
Left a message and advised patient her mammogram orders are in epic and to call and schedule with [place]. Beth

## 2019-08-05 DIAGNOSIS — R928 Other abnormal and inconclusive findings on diagnostic imaging of breast: Secondary | ICD-10-CM | POA: Insufficient documentation

## 2019-08-12 ENCOUNTER — Ambulatory Visit
Admission: RE | Admit: 2019-08-12 | Discharge: 2019-08-12 | Disposition: A | Discharge status: Home / Self Care | Payer: PRIVATE HEALTH INSURANCE | Source: Ambulatory Visit | Attending: Nurse Practitioner | Admitting: Nurse Practitioner

## 2019-08-12 DIAGNOSIS — N6489 Other specified disorders of breast: Secondary | ICD-10-CM | POA: Insufficient documentation

## 2019-08-12 DIAGNOSIS — R928 Other abnormal and inconclusive findings on diagnostic imaging of breast: Secondary | ICD-10-CM

## 2019-09-16 ENCOUNTER — Telehealth: Payer: Self-pay

## 2019-09-16 NOTE — Telephone Encounter (Signed)
Lmom to confirm and screen for 09-18-19 ov.

## 2019-09-18 ENCOUNTER — Other Ambulatory Visit: Payer: Self-pay

## 2019-09-18 ENCOUNTER — Ambulatory Visit: Payer: PRIVATE HEALTH INSURANCE | Admitting: Nurse Practitioner

## 2019-09-18 ENCOUNTER — Encounter: Payer: Self-pay | Admitting: Nurse Practitioner

## 2019-09-18 VITALS — BP 129/77 | HR 82 | Temp 97.7°F | Resp 16 | Ht 63.0 in | Wt 161.8 lb

## 2019-09-18 DIAGNOSIS — I10 Essential (primary) hypertension: Secondary | ICD-10-CM

## 2019-09-18 DIAGNOSIS — E663 Overweight: Secondary | ICD-10-CM | POA: Diagnosis not present

## 2019-09-18 DIAGNOSIS — M5416 Radiculopathy, lumbar region: Secondary | ICD-10-CM | POA: Diagnosis not present

## 2019-09-18 DIAGNOSIS — F411 Generalized anxiety disorder: Secondary | ICD-10-CM | POA: Diagnosis not present

## 2019-09-18 MED ORDER — ALPRAZOLAM 0.25 MG PO TABS: 0.2500 mg | ORAL_TABLET | Freq: Every evening | ORAL | 3 refills | Status: DC | PRN

## 2019-09-18 MED ORDER — PHENTERMINE HCL 37.5 MG PO TABS: 37.5000 mg | ORAL_TABLET | Freq: Every day | ORAL | 1 refills | Status: DC

## 2019-09-18 NOTE — Progress Notes (Signed)
[facility] [place], [place] [address]  Internal MEDICINE  Office Visit Note  Patient Name: Patricia Frazier  [id number]  [id number]  Date of Service: 09/30/2019  Chief Complaint  Patient presents with  . Follow-up  . Depression  . Gastroesophageal Reflux  . Hypertension  . Medication Management    patient was started on lyrica and unaware of dose     The patient is here for follow up of weight management. On phentermine daily. Lost four pounds since her last visit. Blood pressure well controlled. Increasing physical activity. Back and leg are feeling much better. Orthopedic surgeon changed gabapentin to low dose lyrica. This has made a huge difference for her and has allowed her to participate in more routine activities.       Current Medication: Outpatient Encounter Medications as of 09/18/2019  Medication Sig  . ALPRAZolam (XANAX) 0.25 MG tablet Take 1 tablet (0.25 mg total) by mouth at bedtime as needed for anxiety.  Marland Kitchen amLODipine (NORVASC) 10 MG tablet Take 1 tablet (10 mg total) by mouth daily.  Marland Kitchen ibuprofen (ADVIL) 200 MG tablet Take 400 mg by mouth every 6 (six) hours as needed.  . methocarbamol (ROBAXIN) 500 MG tablet Take 1 tablet (500 mg total) by mouth every 6 (six) hours as needed for muscle spasms.  . Multiple Vitamin (MULTIVITAMIN) tablet Take 1 tablet by mouth daily. chewable  . phentermine (ADIPEX-P) 37.5 MG tablet Take 1 tablet (37.5 mg total) by mouth daily before breakfast.  . pravastatin (PRAVACHOL) 40 MG tablet Take 1 tablet (40 mg total) by mouth at bedtime.  . pregabalin (LYRICA) 50 MG capsule Take 50 mg by mouth 3 (three) times daily.  . [DISCONTINUED] ALPRAZolam (XANAX) 0.25 MG tablet Take 1 tablet (0.25 mg total) by mouth at bedtime as needed for anxiety.  . [DISCONTINUED] omeprazole (PRILOSEC OTC) 20 MG tablet Take 1 tablet (20 mg total) by mouth daily.  . [DISCONTINUED] phentermine (ADIPEX-P) 37.5 MG tablet Take 1 tablet  (37.5 mg total) by mouth daily before breakfast.  . [DISCONTINUED] gabapentin (NEURONTIN) 100 MG capsule Take 100 mg by mouth as needed. (Patient not taking: Reported on 09/18/2019)   No facility-administered encounter medications on file as of 09/18/2019.    Surgical History: Past Surgical History:  Procedure Laterality Date  . BREAST CYST ASPIRATION Left yrs ago  . LUMBAR LAMINECTOMY/DECOMPRESSION MICRODISCECTOMY Left 06/18/2018   Procedure: LUMBAR LAMINECTOMY/DECOMPRESSION MICRODISCECTOMY 1 LEVEL, L5-S1  LEFT;  Surgeon: Meade Maw, MD;  Location: [facility] ORS;  Service: Neurosurgery;  Laterality: Left;  . STENT PLACE LEFT URETER ([place] HX)     unsure  of side  . TONSILLECTOMY    . tubiligation      Medical History: Past Medical History:  Diagnosis Date  . Anxiety   . Back pain   . Bulging lumbar disc   . Chest pain   . Depression   . GERD (gastroesophageal reflux disease)   . History of kidney stones   . Hypertension   . Migraine   . Sleep apnea    has never used a cpap machine  . Varicose veins of legs   . Vertigo   . Vision problems     Family History: Family History  Problem Relation Age of Onset  . Hypertension Mother   . Cancer Father   . Hypertension Father   . Hyperlipidemia Father     Social History   Socioeconomic History  . Marital status: Married    Spouse  name: Merry Proud  . Number of children: Not on file  . Years of education: Not on file  . Highest education level: Not on file  Occupational History  . 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 Smoking status: Former Smoker    Types: Cigarettes    Quit date: 2000    Years since quitting: 21.4  . Smokeless tobacco: Never Used  Vaping Use  . Vaping Use: Never used  Substance and Sexual Activity  . Alcohol use: Yes    Comment: socially  . Drug use: No  . Sexual activity: Not Currently  Other Topics Concern  . Not on file  Social History Narrative  . Not on  file   Social Determinants of Health   Financial Resource Strain:   . Difficulty of Paying Living Expenses:   Food Insecurity:   . Worried About Charity fundraiser in the Last Year:   . Arboriculturist in the Last Year:   Transportation Needs:   . Film/video editor (Medical):   Marland Kitchen Lack of Transportation (Non-Medical):   Physical Activity:   . Days of Exercise per Week:   . Minutes of Exercise per Session:   Stress:   . Feeling of Stress :   Social Connections:   . Frequency of Communication with Friends and Family:   . Frequency of Social Gatherings with Friends and Family:   . Attends Religious Services:   . Active Member of Clubs or Organizations:   . Attends Archivist Meetings:   Marland Kitchen Marital Status:   Intimate Partner Violence:   . Fear of Current or Ex-Partner:   . Emotionally Abused:   Marland Kitchen Physically Abused:   . Sexually Abused:       Review of Systems  Constitutional: Positive for activity change. Negative for chills, fatigue and unexpected weight change.       Improved activity levels since changing gabapentin to lyrica three times daily.  HENT: Negative for congestion, postnasal drip, rhinorrhea, sneezing and sore throat.   Respiratory: Negative for cough, chest tightness and shortness of breath.   Cardiovascular: Negative for chest pain, palpitations and leg swelling.       Well managed blood pressure.   Gastrointestinal: Negative for abdominal pain, constipation, diarrhea, nausea and vomiting.  Endocrine: Negative for cold intolerance, heat intolerance, polydipsia and polyuria.  Musculoskeletal: Positive for back pain and myalgias. Negative for arthralgias, joint swelling and neck pain.       Improved since orthopedic provider changed her from gabapentin to lyrica.   Skin: Negative for rash.  Allergic/Immunologic: Negative for environmental allergies.  Neurological: Negative for dizziness, tremors, numbness and headaches.  Hematological: Negative  for adenopathy. Does not bruise/bleed easily.  Psychiatric/Behavioral: Negative for behavioral problems (Depression), sleep disturbance and suicidal ideas. The patient is nervous/anxious.     Today's Vitals   09/18/19 1557  BP: 129/77  Pulse: 82  Resp: 16  Temp: 97.7 F (36.5 C)  SpO2: 96%  Weight: 161 lb 12.8 oz (73.4 kg)  Height: 5\' 3"  (1.6 m)   Body mass index is 28.66 kg/m.  Physical Exam Vitals and nursing note reviewed.  Constitutional:      General: She is not in acute distress.    Appearance: Normal appearance. She is well-developed. She is not diaphoretic.  HENT:     Head: Normocephalic and atraumatic.     Mouth/Throat:     Pharynx: No oropharyngeal exudate.  Eyes:     Pupils: Pupils are equal, round, and reactive to light.  Neck:     Thyroid: No thyromegaly.     Vascular: No carotid bruit or JVD.     Trachea: No tracheal deviation.  Cardiovascular:     Rate and Rhythm: Normal rate and regular rhythm.     Heart sounds: Normal heart sounds. No murmur heard.  No friction rub. No gallop.   Pulmonary:     Effort: Pulmonary effort is normal. No respiratory distress.     Breath sounds: Normal breath sounds. No wheezing or rales.  Chest:     Chest wall: No tenderness.  Abdominal:     Palpations: Abdomen is soft.  Musculoskeletal:        General: Normal range of motion.     Cervical back: Normal range of motion and neck supple.  Lymphadenopathy:     Cervical: No cervical adenopathy.  Skin:    General: Skin is warm and dry.  Neurological:     Mental Status: She is alert and oriented to person, place, and time.     Cranial Nerves: No cranial nerve deficit.  Psychiatric:        Mood and Affect: Mood normal.        Behavior: Behavior normal.        Thought Content: Thought content normal.        Judgment: Judgment normal.    Assessment/Plan:  1. Essential (primary) hypertension Stable. Continue bp medication as prescribed   2. Overweight Improving. May  continue phentermine daily. Limit calorie intake to 1200 calories per day. Continue to increase activity level as needed and as tolerated.  - phentermine (ADIPEX-P) 37.5 MG tablet; Take 1 tablet (37.5 mg total) by mouth daily before breakfast.  Dispense: 30 tablet; Refill: 1  3. GAD (generalized anxiety disorder) May take alprazolam 0.25mg  at bedtime as needed for acute anxiety/insomnia. New prescription sent to her pharmacy today.  - ALPRAZolam (XANAX) 0.25 MG tablet; Take 1 tablet (0.25 mg total) by mouth at bedtime as needed for anxiety.  Dispense: 30 tablet; Refill: 3  4. Lumbar radiculopathy Improved since change of gabapentin to lyrica. This has been updated in her medication list.   General Counseling: Alben Spittle understanding of the findings of todays visit and agrees with plan of treatment. I have discussed any further diagnostic evaluation that may be needed or ordered today. We also reviewed her medications today. she has been encouraged to call the office with any questions or concerns that should arise related to todays visit.   This patient was seen by [place] with Dr Lavera Guise as a part of collaborative care agreement  Meds ordered this encounter  Medications  . ALPRAZolam (XANAX) 0.25 MG tablet    Sig: Take 1 tablet (0.25 mg total) by mouth at bedtime as needed for anxiety.    Dispense:  30 tablet    Refill:  3    Order Specific Question:   Supervising Provider    Answer:   Lavera Guise [[id number]]  . phentermine (ADIPEX-P) 37.5 MG tablet    Sig: Take 1 tablet (37.5 mg total) by mouth daily before breakfast.    Dispense:  30 tablet    Refill:  1    Order Specific Question:   Supervising Provider    Answer:   Lavera Guise [[id number]]    Total time spent: 20 Minutes   Time spent includes review of chart, medications, test  results, and follow up plan with the patient.      Dr Lavera Guise Internal medicine

## 2019-09-22 ENCOUNTER — Other Ambulatory Visit: Payer: Self-pay

## 2019-09-22 DIAGNOSIS — K219 Gastro-esophageal reflux disease without esophagitis: Secondary | ICD-10-CM

## 2019-09-22 MED ORDER — OMEPRAZOLE MAGNESIUM 20 MG PO TBEC: 20.0000 mg | DELAYED_RELEASE_TABLET | Freq: Every day | ORAL | 3 refills | Status: DC

## 2019-10-10 IMAGING — MG DIGITAL SCREENING BILATERAL MAMMOGRAM WITH TOMO AND CAD
8 series · 8 of 24 positions shown · non-contrast
Comparison: Previous exam(s).

CLINICAL DATA: Screening.

EXAM:
DIGITAL SCREENING BILATERAL MAMMOGRAM WITH TOMO AND CAD

[L CC synth-2D]
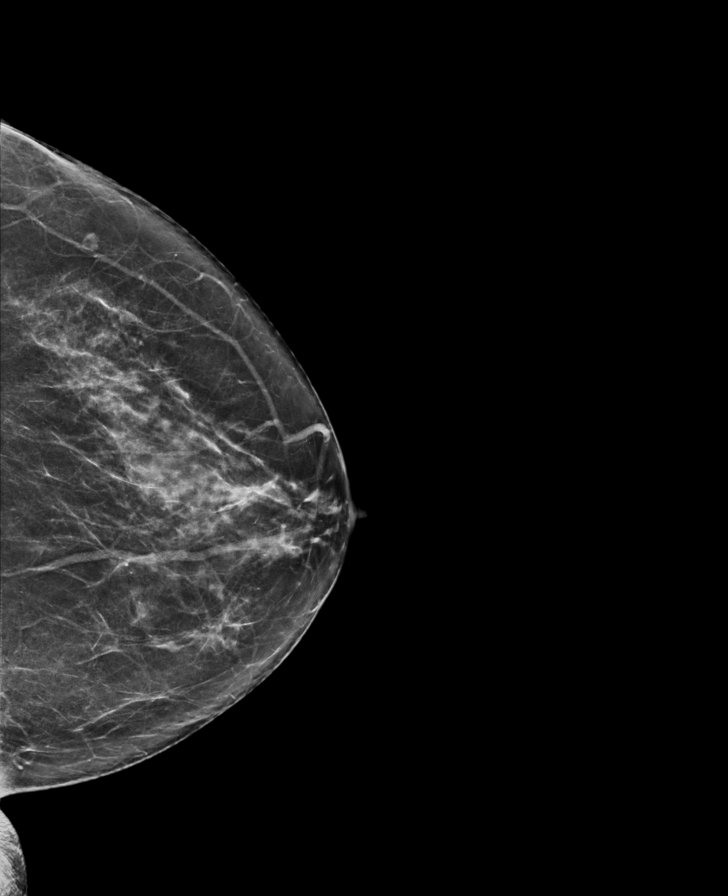

[L MLO synth-2D]
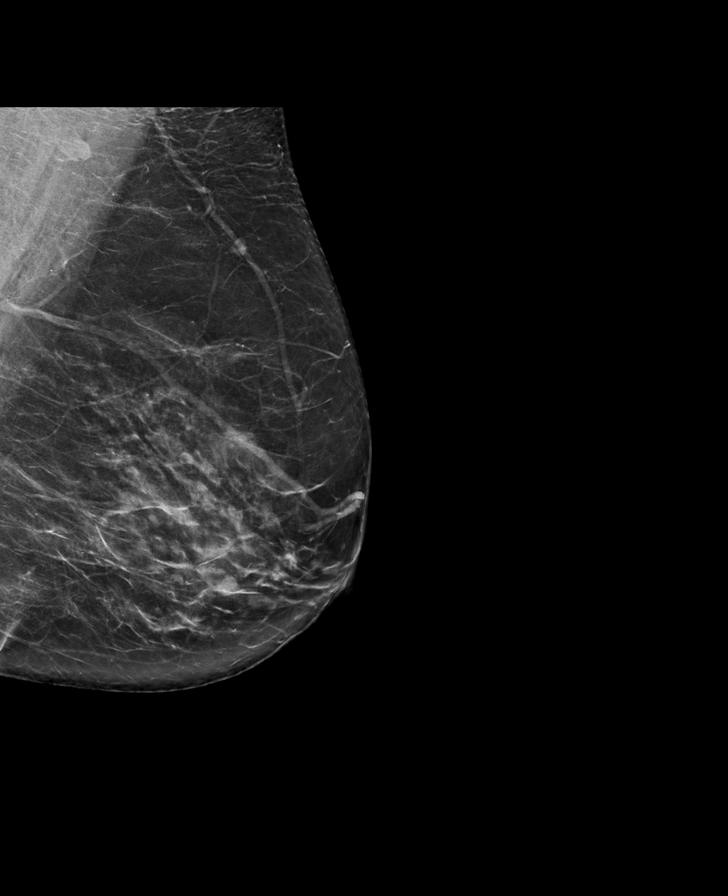

[R MLO synth-2D]
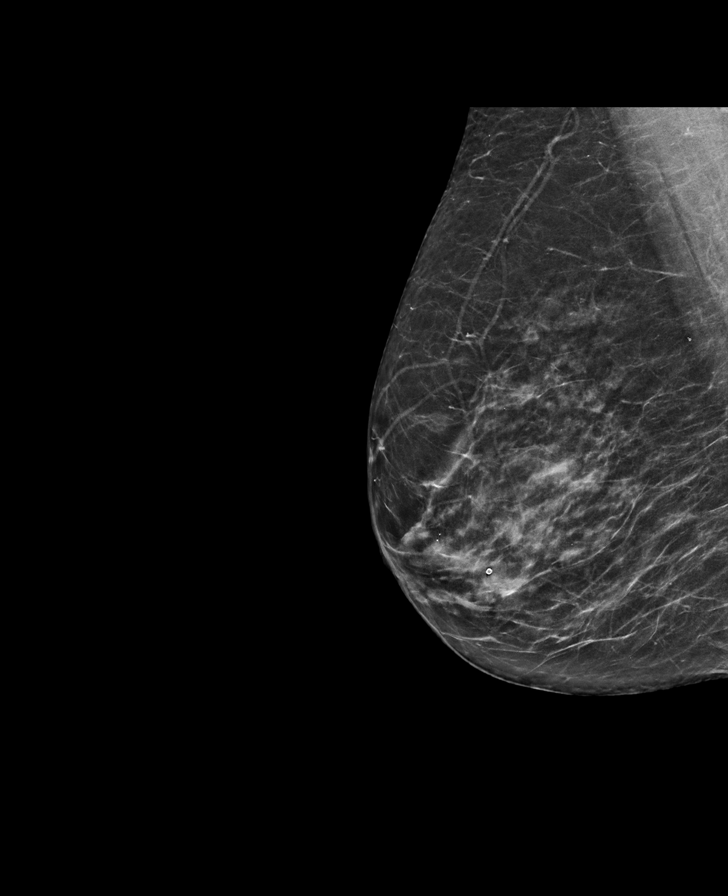

[R CC synth-2D]
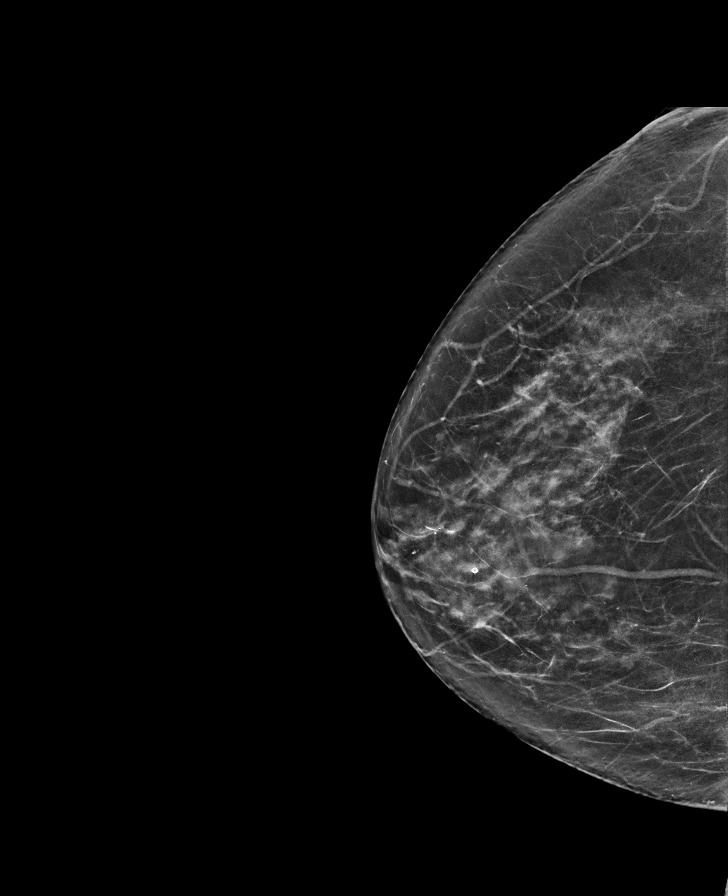

[L MLO tomo · tomo slice 39/76.0]
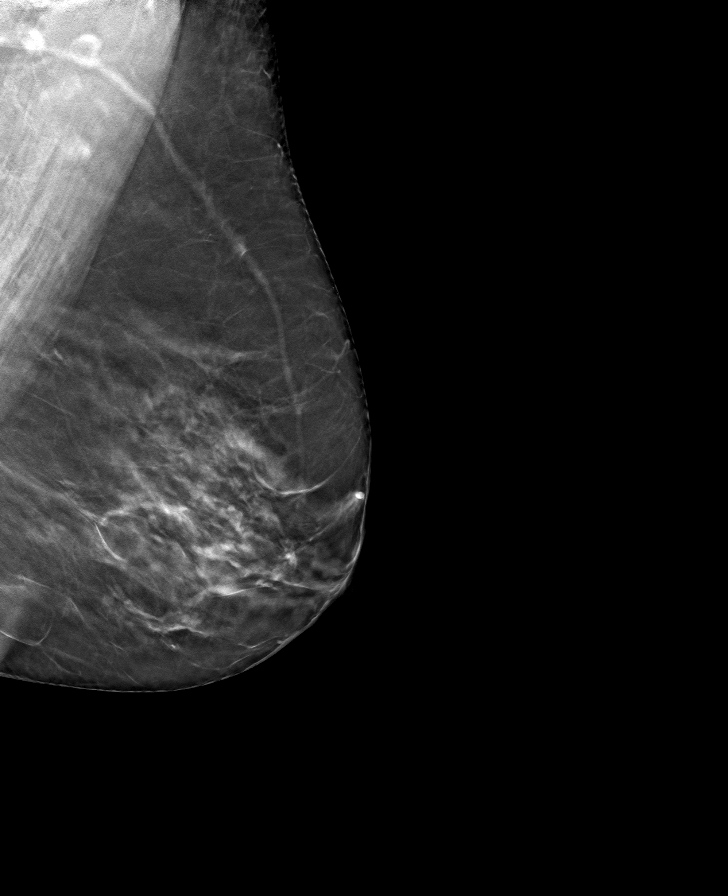

[R MLO tomo · tomo slice 35/68.0]
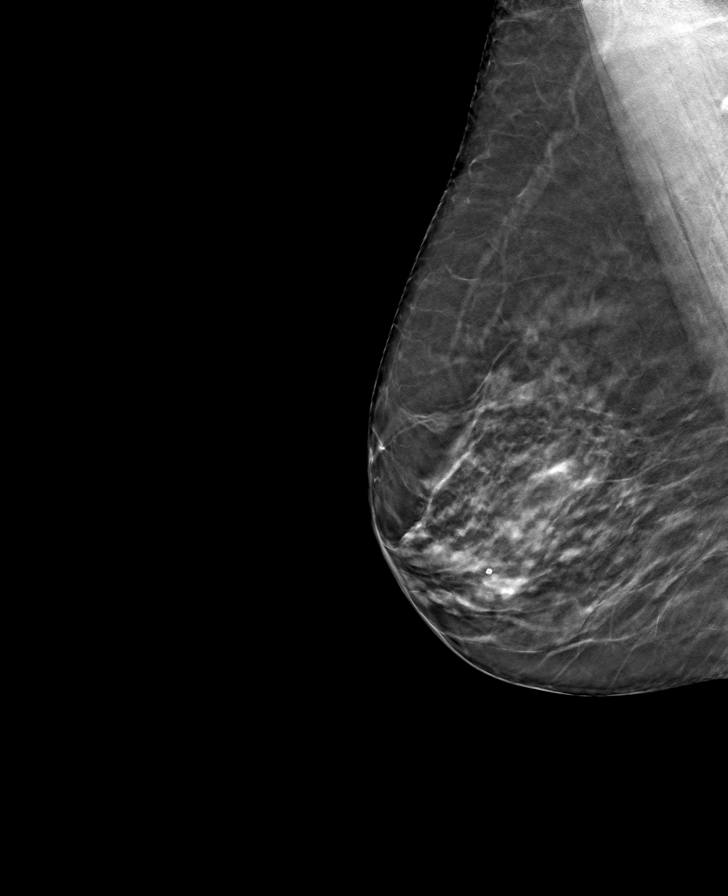

[L CC tomo · tomo slice 34/67.0]
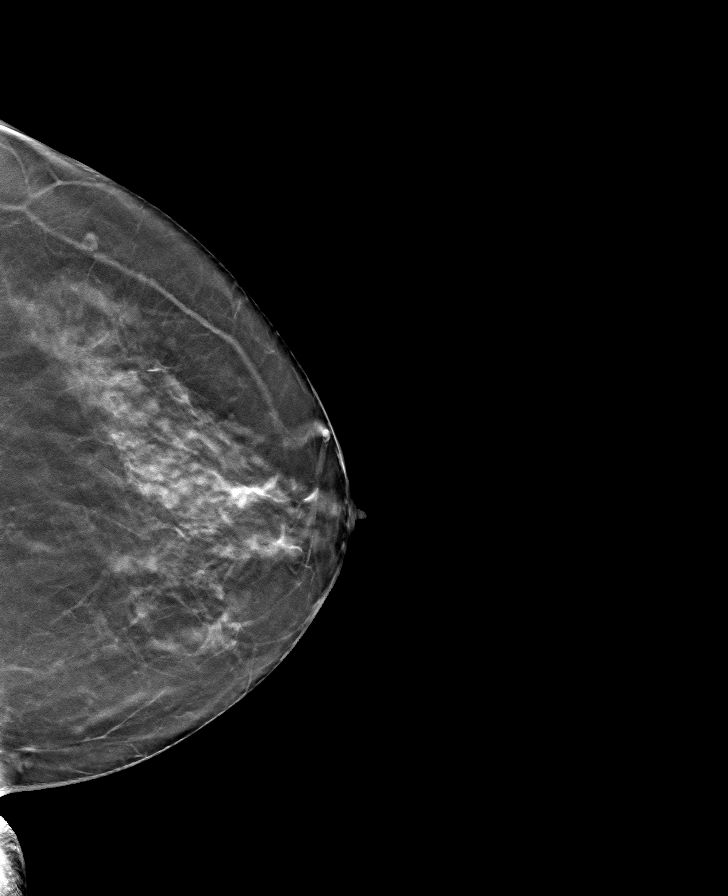

[R CC tomo · tomo slice 35/69.0]
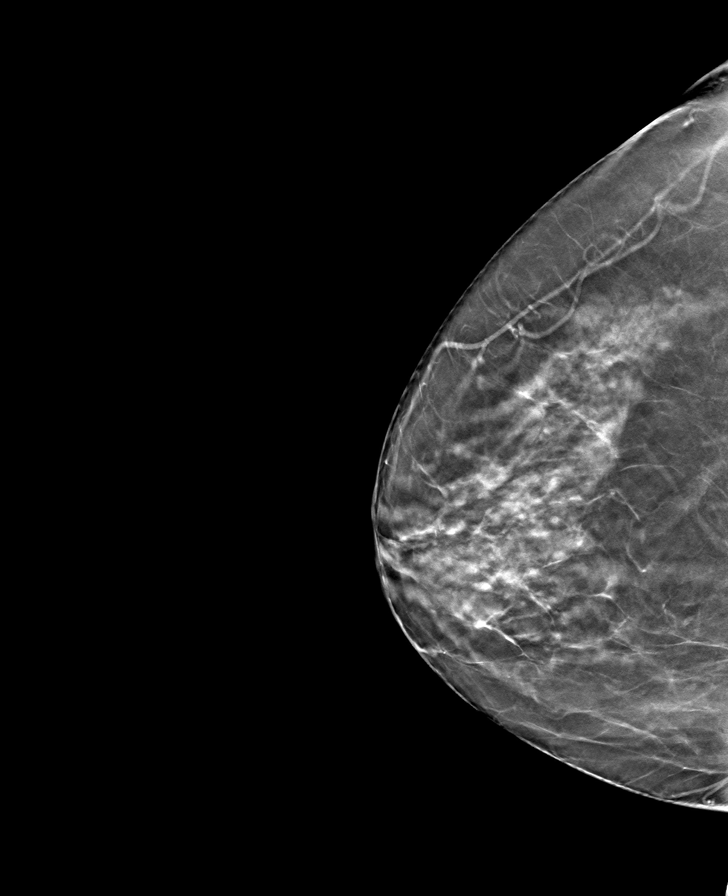

[8 of 24 positions shown; findings below may reference images not displayed]

ACR Breast Density Category c: The breast tissue is heterogeneously
dense, which may obscure small masses.
FINDINGS: There are no findings suspicious for malignancy. Images were
processed with CAD.
IMPRESSION: No mammographic evidence of malignancy. A result letter of this
screening mammogram will be mailed directly to the patient.

RECOMMENDATION:
Screening mammogram in one year. (Code:FT-U-LHB)

BI-RADS CATEGORY  1: Negative.

## 2019-10-14 ENCOUNTER — Other Ambulatory Visit: Payer: Self-pay

## 2019-10-14 DIAGNOSIS — E663 Overweight: Secondary | ICD-10-CM

## 2019-10-14 MED ORDER — PHENTERMINE HCL 37.5 MG PO TABS: 37.5000 mg | ORAL_TABLET | Freq: Every day | ORAL | 0 refills | Status: DC

## 2019-10-14 NOTE — Telephone Encounter (Signed)
Spoke with phar about 09/18/19 pres for phentermine they don't received and as per heather called in for 30 days and pt had follow up on 10/30/19

## 2019-10-23 ENCOUNTER — Ambulatory Visit: Payer: PRIVATE HEALTH INSURANCE | Admitting: Nurse Practitioner

## 2019-10-28 ENCOUNTER — Telehealth: Payer: Self-pay

## 2019-10-28 NOTE — Telephone Encounter (Signed)
Lmom to confirm and screen for 10-30-19 ov.

## 2019-10-30 ENCOUNTER — Other Ambulatory Visit: Payer: Self-pay

## 2019-10-30 ENCOUNTER — Ambulatory Visit: Payer: PRIVATE HEALTH INSURANCE | Admitting: Nurse Practitioner

## 2019-10-30 ENCOUNTER — Encounter: Payer: Self-pay | Admitting: Nurse Practitioner

## 2019-10-30 VITALS — BP 122/82 | HR 86 | Temp 97.6°F | Resp 16 | Ht 63.0 in | Wt 158.0 lb

## 2019-10-30 DIAGNOSIS — F411 Generalized anxiety disorder: Secondary | ICD-10-CM | POA: Diagnosis not present

## 2019-10-30 DIAGNOSIS — M5416 Radiculopathy, lumbar region: Secondary | ICD-10-CM

## 2019-10-30 DIAGNOSIS — I10 Essential (primary) hypertension: Secondary | ICD-10-CM

## 2019-10-30 DIAGNOSIS — E663 Overweight: Secondary | ICD-10-CM | POA: Diagnosis not present

## 2019-10-30 NOTE — Progress Notes (Signed)
[facility] [place], [place] [address]  Internal MEDICINE  Office Visit Note  Patient Name: Patricia Frazier  [id number]  [id number]  Date of Service: 11/25/2019  Chief Complaint  Patient presents with  . Follow-up    Weight Management   . Depression  . Gastroesophageal Reflux  . Hypertension  . Quality Metric Gaps    TDAP    The patient is here for routine follow up. Her blood pressure is doing very well on current medications. States that orthopedic provider recently changed gabapentin to Lyrica 50mg  twice daily as needed. She states that this changed has made such a positie improvement In her back pain. She is able to move around and exercise more often. She has been off and on phentermine to help with weight management. She has lost additional three pounds since she was last seen. She is trying to break from phentermine, especially since she has been able to move around more often. She does not need refills for this medication today.       Current Medication: Outpatient Encounter Medications as of 10/30/2019  Medication Sig  . ALPRAZolam (XANAX) 0.25 MG tablet Take 1 tablet (0.25 mg total) by mouth at bedtime as needed for anxiety.  Marland Kitchen amLODipine (NORVASC) 10 MG tablet Take 1 tablet (10 mg total) by mouth daily.  Marland Kitchen ibuprofen (ADVIL) 200 MG tablet Take 400 mg by mouth every 6 (six) hours as needed.  . methocarbamol (ROBAXIN) 500 MG tablet Take 1 tablet (500 mg total) by mouth every 6 (six) hours as needed for muscle spasms.  . Multiple Vitamin (MULTIVITAMIN) tablet Take 1 tablet by mouth daily. chewable  . omeprazole (PRILOSEC OTC) 20 MG tablet Take 1 tablet (20 mg total) by mouth daily.  . phentermine (ADIPEX-P) 37.5 MG tablet Take 1 tablet (37.5 mg total) by mouth daily before breakfast.  . pravastatin (PRAVACHOL) 40 MG tablet Take 1 tablet (40 mg total) by mouth at bedtime.  . pregabalin (LYRICA) 50 MG capsule Take 50 mg by mouth 2 times daily at  12 noon and 4 pm. Taken 1-2 times daily as needed.   No facility-administered encounter medications on file as of 10/30/2019.    Surgical History: Past Surgical History:  Procedure Laterality Date  . BREAST CYST ASPIRATION Left yrs ago  . LUMBAR LAMINECTOMY/DECOMPRESSION MICRODISCECTOMY Left 06/18/2018   Procedure: LUMBAR LAMINECTOMY/DECOMPRESSION MICRODISCECTOMY 1 LEVEL, L5-S1  LEFT;  Surgeon: Patricia Maw, MD;  Location: [facility] ORS;  Service: Neurosurgery;  Laterality: Left;  . STENT PLACE LEFT URETER ([place] HX)     unsure  of side  . TONSILLECTOMY    . tubiligation      Medical History: Past Medical History:  Diagnosis Date  . Anxiety   . Back pain   . Bulging lumbar disc   . Chest pain   . Depression   . GERD (gastroesophageal reflux disease)   . History of kidney stones   . Hypertension   . Migraine   . Sleep apnea    has never used a cpap machine  . Varicose veins of legs   . Vertigo   . Vision problems     Family History: Family History  Problem Relation Age of Onset  . Hypertension Mother   . Cancer Father   . Hypertension Father   . Hyperlipidemia Father     Social History   Socioeconomic History  . Marital status: Married    Spouse name: Patricia Frazier  . Number of children:  Not on file  . Years of education: Not on file  . Highest education level: Not on file  Occupational History  . 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 Smoking status: Former Smoker    Types: Cigarettes    Quit date: 2000    Years since quitting: 21.6  . Smokeless tobacco: Never Used  Vaping Use  . Vaping Use: Never used  Substance and Sexual Activity  . Alcohol use: Yes    Comment: socially  . Drug use: No  . Sexual activity: Not Currently  Other Topics Concern  . Not on file  Social History Narrative  . Not on file   Social Determinants of Health   Financial Resource Strain:   . Difficulty of Paying Living Expenses:   Food  Insecurity:   . Worried About Charity fundraiser in the Last Year:   . Arboriculturist in the Last Year:   Transportation Needs:   . Film/video editor (Medical):   Marland Kitchen Lack of Transportation (Non-Medical):   Physical Activity:   . Days of Exercise per Week:   . Minutes of Exercise per Session:   Stress:   . Feeling of Stress :   Social Connections:   . Frequency of Communication with Friends and Family:   . Frequency of Social Gatherings with Friends and Family:   . Attends Religious Services:   . Active Member of Clubs or Organizations:   . Attends Archivist Meetings:   Marland Kitchen Marital Status:   Intimate Partner Violence:   . Fear of Current or Ex-Partner:   . Emotionally Abused:   Marland Kitchen Physically Abused:   . Sexually Abused:       Review of Systems  Constitutional: Positive for activity change. Negative for chills, fatigue and unexpected weight change.       Continues with improved activity level. Has lost additional 3 pounds since her last visit.   HENT: Negative for congestion, postnasal drip, rhinorrhea, sneezing and sore throat.   Respiratory: Negative for cough, chest tightness, shortness of breath and wheezing.   Cardiovascular: Negative for chest pain, palpitations and leg swelling.       Well managed blood pressure.   Gastrointestinal: Negative for abdominal pain, constipation, diarrhea, nausea and vomiting.  Endocrine: Negative for cold intolerance, heat intolerance, polydipsia and polyuria.  Musculoskeletal: Positive for back pain and myalgias. Negative for arthralgias, joint swelling and neck pain.       Improved since orthopedic provider changed her from gabapentin to lyrica.   Skin: Negative for rash.  Allergic/Immunologic: Negative for environmental allergies.  Neurological: Negative for dizziness, tremors, numbness and headaches.  Hematological: Negative for adenopathy. Does not bruise/bleed easily.  Psychiatric/Behavioral: Negative for behavioral  problems (Depression), sleep disturbance and suicidal ideas. The patient is nervous/anxious.     Today's Vitals   10/30/19 1153  BP: 122/82  Pulse: 86  Resp: 16  Temp: 97.6 F (36.4 C)  SpO2: 97%  Weight: 158 lb (71.7 kg)  Height: 5\' 3"  (1.6 m)   Body mass index is 27.99 kg/m.  Physical Exam Vitals and nursing note reviewed.  Constitutional:      General: She is not in acute distress.    Appearance: Normal appearance. She is well-developed. She is not diaphoretic.  HENT:     Head: Normocephalic and atraumatic.     Mouth/Throat:     Pharynx: No oropharyngeal exudate.  Eyes:  Pupils: Pupils are equal, round, and reactive to light.  Neck:     Thyroid: No thyromegaly.     Vascular: No carotid bruit or JVD.     Trachea: No tracheal deviation.  Cardiovascular:     Rate and Rhythm: Normal rate and regular rhythm.     Heart sounds: Normal heart sounds. No murmur heard.  No friction rub. No gallop.   Pulmonary:     Effort: Pulmonary effort is normal. No respiratory distress.     Breath sounds: Normal breath sounds. No wheezing or rales.  Chest:     Chest wall: No tenderness.  Abdominal:     Palpations: Abdomen is soft.  Musculoskeletal:        General: Normal range of motion.     Cervical back: Normal range of motion and neck supple.  Lymphadenopathy:     Cervical: No cervical adenopathy.  Skin:    General: Skin is warm and dry.  Neurological:     Mental Status: She is alert and oriented to person, place, and time.     Cranial Nerves: No cranial nerve deficit.  Psychiatric:        Mood and Affect: Mood normal.        Behavior: Behavior normal.        Thought Content: Thought content normal.        Judgment: Judgment normal.    Assessment/Plan: 1. Essential (primary) hypertension Stable. Continue bp medication as prescribed.   2. Lumbar radiculopathy Doing much better on lyrica 50mg  twice daily. Continue regular visits with orthopedic provider as scheduled.    3. Overweight Continue to limit calorie intake to 1200 calories per day. Incorporate exercise into daily routine.   4. GAD (generalized anxiety disorder) May take alprazolam 0.25mg  at bedtime as needed for acute anxiety/insomnia.   General Counseling: Patricia Frazier understanding of the findings of todays visit and agrees with plan of treatment. I have discussed any further diagnostic evaluation that may be needed or ordered today. We also reviewed her medications today. she has been encouraged to call the office with any questions or concerns that should arise related to todays visit.  This patient was seen by Patricia Pol FNP Collaboration with Dr Patricia Frazier as a part of collaborative care agreement   Total time spent: 20 Minutes   Time spent includes review of chart, medications, test results, and follow up plan with the patient.      Dr Patricia Frazier Internal medicine

## 2020-01-08 ENCOUNTER — Ambulatory Visit: Payer: PRIVATE HEALTH INSURANCE | Admitting: Nurse Practitioner

## 2020-01-15 ENCOUNTER — Other Ambulatory Visit: Payer: Self-pay

## 2020-01-15 DIAGNOSIS — I10 Essential (primary) hypertension: Secondary | ICD-10-CM

## 2020-01-15 MED ORDER — AMLODIPINE BESYLATE 10 MG PO TABS: 10.0000 mg | ORAL_TABLET | Freq: Every day | ORAL | 2 refills | Status: DC

## 2020-01-26 ENCOUNTER — Other Ambulatory Visit: Payer: Self-pay

## 2020-01-26 ENCOUNTER — Encounter: Payer: Self-pay | Admitting: Nurse Practitioner

## 2020-01-26 ENCOUNTER — Ambulatory Visit: Payer: PRIVATE HEALTH INSURANCE | Admitting: Nurse Practitioner

## 2020-01-26 VITALS — BP 138/77 | HR 74 | Temp 97.5°F | Resp 16 | Ht 63.0 in | Wt 161.0 lb

## 2020-01-26 DIAGNOSIS — I10 Essential (primary) hypertension: Secondary | ICD-10-CM

## 2020-01-26 DIAGNOSIS — Z23 Encounter for immunization: Secondary | ICD-10-CM

## 2020-01-26 DIAGNOSIS — M5416 Radiculopathy, lumbar region: Secondary | ICD-10-CM

## 2020-01-26 DIAGNOSIS — E782 Mixed hyperlipidemia: Secondary | ICD-10-CM | POA: Diagnosis not present

## 2020-01-26 MED ORDER — PNEUMOCOCCAL 13-VAL CONJ VACC IM SUSP: 0.5000 mL | Freq: Once | INTRAMUSCULAR | 0 refills | Status: AC

## 2020-01-26 NOTE — Progress Notes (Signed)
[facility] [place], [place] [address]  Internal MEDICINE  Office Visit Note  Patient Name: Patricia Frazier  [id number]  [id number]  Date of Service: 02/17/2020  Chief Complaint  Patient presents with  . Follow-up    wt management  . Depression  . Gastroesophageal Reflux  . Hypertension  . Quality Metric Gaps    flu, tetnaus  . controlled substance form    reviewed with PT    The patient is here for follow up visit. Her blood pressure is doing very well on current medications. Stopped taking her Lyrica 50mg  twice daily as needed. She states that this change made such a positie improvement In her back pain, she did not feel like she needed it any longer. She was able to move around and exercise more often. She plans to make a new appointment with her orthopedic provider to discuss further treatment . She has been off phentermine to help with weight management. She has gained three pounds since she was last seen.  She has no other concerns or complaints today. She should get vaccine for pneumonia. She plans to get her flu shot from work.       Current Medication: Outpatient Encounter Medications as of 01/26/2020  Medication Sig  . ALPRAZolam (XANAX) 0.25 MG tablet Take 1 tablet (0.25 mg total) by mouth at bedtime as needed for anxiety.  Marland Kitchen amLODipine (NORVASC) 10 MG tablet Take 1 tablet (10 mg total) by mouth daily.  Marland Kitchen ibuprofen (ADVIL) 200 MG tablet Take 400 mg by mouth every 6 (six) hours as needed.  . methocarbamol (ROBAXIN) 500 MG tablet Take 1 tablet (500 mg total) by mouth every 6 (six) hours as needed for muscle spasms.  . Multiple Vitamin (MULTIVITAMIN) tablet Take 1 tablet by mouth daily. chewable  . omeprazole (PRILOSEC OTC) 20 MG tablet Take 1 tablet (20 mg total) by mouth daily.  . phentermine (ADIPEX-P) 37.5 MG tablet Take 1 tablet (37.5 mg total) by mouth daily before breakfast.  . pravastatin (PRAVACHOL) 40 MG tablet Take 1 tablet (40  mg total) by mouth at bedtime.  . pregabalin (LYRICA) 50 MG capsule Take 50 mg by mouth 2 times daily at 12 noon and 4 pm. Taken 1-2 times daily as needed.  . [EXPIRED] pneumococcal 13-valent conjugate vaccine (PREVNAR 13) SUSP injection Inject 0.5 mLs into the muscle once for 1 dose.   No facility-administered encounter medications on file as of 01/26/2020.    Surgical History: Past Surgical History:  Procedure Laterality Date  . BREAST CYST ASPIRATION Left yrs ago  . LUMBAR LAMINECTOMY/DECOMPRESSION MICRODISCECTOMY Left 06/18/2018   Procedure: LUMBAR LAMINECTOMY/DECOMPRESSION MICRODISCECTOMY 1 LEVEL, L5-S1  LEFT;  Surgeon: Meade Maw, MD;  Location: [facility] ORS;  Service: Neurosurgery;  Laterality: Left;  . STENT PLACE LEFT URETER ([place] HX)     unsure  of side  . TONSILLECTOMY    . tubiligation      Medical History: Past Medical History:  Diagnosis Date  . Anxiety   . Back pain   . Bulging lumbar disc   . Chest pain   . Depression   . GERD (gastroesophageal reflux disease)   . History of kidney stones   . Hypertension   . Migraine   . Sleep apnea    has never used a cpap machine  . Varicose veins of legs   . Vertigo   . Vision problems     Family History: Family History  Problem Relation Age of  Onset  . Hypertension Mother   . Cancer Father   . Hypertension Father   . Hyperlipidemia Father     Social History   Socioeconomic History  . Marital status: Married    Spouse name: Merry Proud  . Number of children: Not on file  . Years of education: Not on file  . Highest education level: Not on file  Occupational History  . 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 Smoking status: Former Smoker    Types: Cigarettes    Quit date: 2000    Years since quitting: 21.8  . Smokeless tobacco: Never Used  Vaping Use  . Vaping Use: Never used  Substance and Sexual Activity  . Alcohol use: Yes    Comment: socially  . Drug use: No  .  Sexual activity: Not Currently  Other Topics Concern  . Not on file  Social History Narrative  . Not on file   Social Determinants of Health   Financial Resource Strain:   . Difficulty of Paying Living Expenses: Not on file  Food Insecurity:   . Worried About Charity fundraiser in the Last Year: Not on file  . Ran Out of Food in the Last Year: Not on file  Transportation Needs:   . Lack of Transportation (Medical): Not on file  . Lack of Transportation (Non-Medical): Not on file  Physical Activity:   . Days of Exercise per Week: Not on file  . Minutes of Exercise per Session: Not on file  Stress:   . Feeling of Stress : Not on file  Social Connections:   . Frequency of Communication with Friends and Family: Not on file  . Frequency of Social Gatherings with Friends and Family: Not on file  . Attends Religious Services: Not on file  . Active Member of Clubs or Organizations: Not on file  . Attends Archivist Meetings: Not on file  . Marital Status: Not on file  Intimate Partner Violence:   . Fear of Current or Ex-Partner: Not on file  . Emotionally Abused: Not on file  . Physically Abused: Not on file  . Sexually Abused: Not on file      Review of Systems  Constitutional: Positive for activity change. Negative for chills, fatigue and unexpected weight change.       Three pound weight gain since her most recent visit.   HENT: Negative for congestion, postnasal drip, rhinorrhea, sneezing and sore throat.   Respiratory: Negative for cough, chest tightness, shortness of breath and wheezing.   Cardiovascular: Negative for chest pain, palpitations and leg swelling.       Well managed blood pressure.   Gastrointestinal: Negative for abdominal pain, constipation, diarrhea, nausea and vomiting.  Endocrine: Negative for cold intolerance, heat intolerance, polydipsia and polyuria.  Musculoskeletal: Positive for back pain and myalgias. Negative for arthralgias, joint  swelling and neck pain.       Worsening lower back pain which radiates into her hips and legs. Making it difficult for her to walk and exercise again.   Skin: Negative for rash.  Allergic/Immunologic: Negative for environmental allergies.  Neurological: Negative for dizziness, tremors, numbness and headaches.  Hematological: Negative for adenopathy. Does not bruise/bleed easily.  Psychiatric/Behavioral: Negative for behavioral problems (Depression), sleep disturbance and suicidal ideas. The patient is nervous/anxious.    Today's Vitals   01/26/20 1537  BP: 138/77  Pulse: 74  Resp: 16  Temp: (!) 97.5  F (36.4 C)  SpO2: 92%  Weight: 161 lb (73 kg)  Height: 5\' 3"  (1.6 m)   Body mass index is 28.52 kg/m.  Physical Exam Vitals and nursing note reviewed.  Constitutional:      General: She is not in acute distress.    Appearance: Normal appearance. She is well-developed. She is not diaphoretic.  HENT:     Head: Normocephalic and atraumatic.     Mouth/Throat:     Pharynx: No oropharyngeal exudate.  Eyes:     Pupils: Pupils are equal, round, and reactive to light.  Neck:     Thyroid: No thyromegaly.     Vascular: No carotid bruit or JVD.     Trachea: No tracheal deviation.  Cardiovascular:     Rate and Rhythm: Normal rate and regular rhythm.     Heart sounds: Normal heart sounds. No murmur heard.  No friction rub. No gallop.   Pulmonary:     Effort: Pulmonary effort is normal. No respiratory distress.     Breath sounds: Normal breath sounds. No wheezing or rales.  Chest:     Chest wall: No tenderness.  Abdominal:     Palpations: Abdomen is soft.  Musculoskeletal:        General: Normal range of motion.     Cervical back: Normal range of motion and neck supple.     Comments: Worsening lower back pain which is worse with bending and twisting at the waist. Sitting or standing for longer periods of time increase pain.   Lymphadenopathy:     Cervical: No cervical adenopathy.   Skin:    General: Skin is warm and dry.  Neurological:     Mental Status: She is alert and oriented to person, place, and time.     Cranial Nerves: No cranial nerve deficit.  Psychiatric:        Mood and Affect: Mood normal.        Behavior: Behavior normal.        Thought Content: Thought content normal.        Judgment: Judgment normal.    Assessment/Plan: 1. Essential (primary) hypertension Blood pressure stable. Continue medication as prescribed   2. Mixed hyperlipidemia Continue pravastatin as prescribed   3. Lumbar radiculopathy Suggested that she restart her lyrica, prescribed per her orthopedic provider. Also encouraged her to contact that provider for further evaluation and treatment.   4. Need for vaccination against Streptococcus pneumoniae using pneumococcal conjugate vaccine 7 Prescription for prrevnar 13 sent to her pharmacy for administration.  - pneumococcal 13-valent conjugate vaccine (PREVNAR 13) SUSP injection; Inject 0.5 mLs into the muscle once for 1 dose.  Dispense: 0.5 mL; Refill: 0  General Counseling: Ismelda verbalizes understanding of the findings of todays visit and agrees with plan of treatment. I have discussed any further diagnostic evaluation that may be needed or ordered today. We also reviewed her medications today. she has been encouraged to call the office with any questions or concerns that should arise related to todays visit.   This patient was seen by [place] with Dr Lavera Guise as a part of collaborative care agreement  Meds ordered this encounter  Medications  . pneumococcal 13-valent conjugate vaccine (PREVNAR 13) SUSP injection    Sig: Inject 0.5 mLs into the muscle once for 1 dose.    Dispense:  0.5 mL    Refill:  0    Order Specific Question:   Supervising Provider    Answer:  Clayborn Bigness M [[id number]]    Total time spent: 25 Minutes   Time spent includes review of chart, medications, test results, and  follow up plan with the patient.      Dr Lavera Guise Internal medicine

## 2020-02-17 DIAGNOSIS — Z23 Encounter for immunization: Secondary | ICD-10-CM | POA: Insufficient documentation

## 2020-05-26 ENCOUNTER — Encounter: Payer: Self-pay | Admitting: Hospice and Palliative Medicine

## 2020-05-26 ENCOUNTER — Other Ambulatory Visit: Payer: Self-pay

## 2020-05-26 ENCOUNTER — Ambulatory Visit (INDEPENDENT_AMBULATORY_CARE_PROVIDER_SITE_OTHER): Payer: Medicare HMO | Admitting: Hospice and Palliative Medicine

## 2020-05-26 VITALS — BP 127/75 | HR 98 | Temp 97.6°F | Resp 16 | Ht 63.0 in | Wt 167.0 lb

## 2020-05-26 DIAGNOSIS — I10 Essential (primary) hypertension: Secondary | ICD-10-CM

## 2020-05-26 DIAGNOSIS — M5416 Radiculopathy, lumbar region: Secondary | ICD-10-CM | POA: Diagnosis not present

## 2020-05-26 DIAGNOSIS — Z0001 Encounter for general adult medical examination with abnormal findings: Secondary | ICD-10-CM | POA: Diagnosis not present

## 2020-05-26 DIAGNOSIS — R300 Dysuria: Secondary | ICD-10-CM

## 2020-05-26 DIAGNOSIS — F411 Generalized anxiety disorder: Secondary | ICD-10-CM | POA: Diagnosis not present

## 2020-05-26 DIAGNOSIS — Z6829 Body mass index (BMI) 29.0-29.9, adult: Secondary | ICD-10-CM

## 2020-05-26 DIAGNOSIS — Z1231 Encounter for screening mammogram for malignant neoplasm of breast: Secondary | ICD-10-CM | POA: Diagnosis not present

## 2020-05-26 DIAGNOSIS — R69 Illness, unspecified: Secondary | ICD-10-CM | POA: Diagnosis not present

## 2020-05-26 DIAGNOSIS — E663 Overweight: Secondary | ICD-10-CM

## 2020-05-26 DIAGNOSIS — R5383 Other fatigue: Secondary | ICD-10-CM | POA: Diagnosis not present

## 2020-05-26 MED ORDER — TOPIRAMATE 25 MG PO TABS: 25.0000 mg | ORAL_TABLET | Freq: Every day | ORAL | 0 refills | Status: DC

## 2020-05-26 MED ORDER — ALPRAZOLAM 0.25 MG PO TABS: 0.2500 mg | ORAL_TABLET | Freq: Every evening | ORAL | 1 refills | Status: DC | PRN

## 2020-05-26 NOTE — Progress Notes (Signed)
[facility] [place], [place] [address]  Internal MEDICINE  Office Visit Note  Patient Name: Patricia Frazier  [id number]  [id number]  Date of Service: 05/29/2020  Chief Complaint  Patient presents with  . Annual Exam  . Depression  . Gastroesophageal Reflux  . Hypertension     HPI Pt is here for routine health maintenance examination Followed by neurosurgery for chronic back pain--weaned off of Lyrica, pain is well controlled Wanting to discuss weight loss options--has been on phentermine in the past, wanting to try other options Continues to take low dose alprazolam as needed at bedtime for insomnia Due for routine labs Needs updated mammogram  Current Medication: Outpatient Encounter Medications as of 05/26/2020  Medication Sig  . topiramate (TOPAMAX) 25 MG tablet Take 1 tablet (25 mg total) by mouth daily.  Marland Kitchen ALPRAZolam (XANAX) 0.25 MG tablet Take 1 tablet (0.25 mg total) by mouth at bedtime as needed for anxiety.  Marland Kitchen amLODipine (NORVASC) 10 MG tablet Take 1 tablet (10 mg total) by mouth daily.  Marland Kitchen ibuprofen (ADVIL) 200 MG tablet Take 400 mg by mouth every 6 (six) hours as needed.  . methocarbamol (ROBAXIN) 500 MG tablet Take 1 tablet (500 mg total) by mouth every 6 (six) hours as needed for muscle spasms.  . Multiple Vitamin (MULTIVITAMIN) tablet Take 1 tablet by mouth daily. chewable  . omeprazole (PRILOSEC OTC) 20 MG tablet Take 1 tablet (20 mg total) by mouth daily.  . pravastatin (PRAVACHOL) 40 MG tablet Take 1 tablet (40 mg total) by mouth at bedtime.  . [DISCONTINUED] ALPRAZolam (XANAX) 0.25 MG tablet Take 1 tablet (0.25 mg total) by mouth at bedtime as needed for anxiety.  . [DISCONTINUED] phentermine (ADIPEX-P) 37.5 MG tablet Take 1 tablet (37.5 mg total) by mouth daily before breakfast.  . [DISCONTINUED] pregabalin (LYRICA) 50 MG capsule Take 50 mg by mouth 2 times daily at 12 noon and 4 pm. Taken 1-2 times daily as needed.   No  facility-administered encounter medications on file as of 05/26/2020.    Surgical History: Past Surgical History:  Procedure Laterality Date  . BREAST CYST ASPIRATION Left yrs ago  . LUMBAR LAMINECTOMY/DECOMPRESSION MICRODISCECTOMY Left 06/18/2018   Procedure: LUMBAR LAMINECTOMY/DECOMPRESSION MICRODISCECTOMY 1 LEVEL, L5-S1  LEFT;  Surgeon: Meade Maw, MD;  Location: [facility] ORS;  Service: Neurosurgery;  Laterality: Left;  . STENT PLACE LEFT URETER ([place] HX)     unsure  of side  . TONSILLECTOMY    . tubiligation      Medical History: Past Medical History:  Diagnosis Date  . Anxiety   . Back pain   . Bulging lumbar disc   . Chest pain   . Depression   . GERD (gastroesophageal reflux disease)   . History of kidney stones   . Hypertension   . Migraine   . Sleep apnea    has never used a cpap machine  . Varicose veins of legs   . Vertigo   . Vision problems     Family History: Family History  Problem Relation Age of Onset  . Hypertension Mother   . Cancer Father   . Hypertension Father   . Hyperlipidemia Father       Review of Systems  Constitutional: Negative for chills, diaphoresis and fatigue.  HENT: Negative for ear pain, postnasal drip and sinus pressure.   Eyes: Negative for photophobia, discharge, redness, itching and visual disturbance.  Respiratory: Negative for cough, shortness of breath and wheezing.  Cardiovascular: Negative for chest pain, palpitations and leg swelling.  Gastrointestinal: Negative for abdominal pain, constipation, diarrhea, nausea and vomiting.  Genitourinary: Negative for dysuria and flank pain.  Musculoskeletal: Negative for arthralgias, back pain, gait problem and neck pain.  Skin: Negative for color change.  Allergic/Immunologic: Negative for environmental allergies and food allergies.  Neurological: Negative for dizziness and headaches.  Hematological: Does not bruise/bleed easily.  Psychiatric/Behavioral: Negative for  agitation, behavioral problems (depression) and hallucinations.     Vital Signs: BP 127/75   Pulse 98   Temp 97.6 F (36.4 C)   Resp 16   Ht 5\' 3"  (1.6 m)   Wt 167 lb (75.8 kg)   SpO2 98%   BMI 29.58 kg/m    Physical Exam Vitals reviewed.  Constitutional:      Appearance: Normal appearance. She is obese.  Cardiovascular:     Rate and Rhythm: Normal rate and regular rhythm.     Pulses: Normal pulses.     Heart sounds: Normal heart sounds.  Pulmonary:     Effort: Pulmonary effort is normal.     Breath sounds: Normal breath sounds.  Abdominal:     General: Abdomen is flat.  Musculoskeletal:        General: Normal range of motion.     Cervical back: Normal range of motion.  Skin:    General: Skin is warm.  Neurological:     General: No focal deficit present.     Mental Status: She is alert and oriented to person, place, and time. Mental status is at baseline.  Psychiatric:        Mood and Affect: Mood normal.        Behavior: Behavior normal.        Thought Content: Thought content normal.        Judgment: Judgment normal.      LABS: Recent Results (from the past 2160 hour(s))  UA/M w/rflx Culture, Routine     Status: Abnormal (Preliminary result)   Collection Time: 05/26/20  4:20 PM   Specimen: Urine   Urine  Result Value Ref Range   Specific Gravity, UA 1.022 1.005 - 1.030   pH, UA 5.0 5.0 - 7.5   Color, UA Yellow Yellow   Appearance Ur Clear Clear   Leukocytes,UA 1+ (A) Negative   Protein,UA Negative Negative/Trace   Glucose, UA Negative Negative   Ketones, UA Trace (A) Negative   RBC, UA Negative Negative   Bilirubin, UA Negative Negative   Urobilinogen, Ur 0.2 0.2 - 1.0 mg/dL   Nitrite, UA Positive (A) Negative   Microscopic Examination See below:     Comment: Microscopic was indicated and was performed.   Urinalysis Reflex Comment     Comment: This specimen has reflexed to a Urine Culture.  Microscopic Examination     Status: Abnormal    Collection Time: 05/26/20  4:20 PM   Urine  Result Value Ref Range   WBC, UA 11-30 (A) 0 - 5 /hpf   RBC 0-2 0 - 2 /hpf   Epithelial Cells (non renal) >10 (A) 0 - 10 /hpf   Casts None seen None seen /lpf   Bacteria, UA Many (A) None seen/Few  Urine Culture, Reflex     Status: None (Preliminary result)   Collection Time: 05/26/20  4:20 PM   Urine  Result Value Ref Range   Urine Culture, Routine WILL FOLLOW     Assessment/Plan: 1. Encounter for routine adult health examination with abnormal findings  Well appearing 45 female Will update PHM  2. GAD (generalized anxiety disorder) Well controlled, continue with alprazolam as needed [place] Controlled Substance Database was reviewed by me for overdose risk score (ORS) - ALPRAZolam (XANAX) 0.25 MG tablet; Take 1 tablet (0.25 mg total) by mouth at bedtime as needed for anxiety.  Dispense: 30 tablet; Refill: 1  3. Encounter for screening mammogram for malignant neoplasm of breast - MM Digital Screening; Future  4. Other fatigue - CBC w/Diff/Platelet - Comprehensive Metabolic Panel (CMET) - Lipid Panel With LDL/HDL Ratio - TSH + free T4  5. Essential hypertension BP and HR well controlled on current therapy, continue to monitor  6. Lumbar radiculopathy Followed by neurosurgery  7. Dysuria - UA/M w/rflx Culture, Routine - Microscopic Examination - Urine Culture, Reflex  8. Overweight with body mass index (BMI) of 29 to 29.9 in adult Start low dose Topamax, consider adding Wellbutrin at next visit - topiramate (TOPAMAX) 25 MG tablet; Take 1 tablet (25 mg total) by mouth daily.  Dispense: 90 tablet; Refill: 0  General Counseling: Taronda verbalizes understanding of the findings of todays visit and agrees with plan of treatment. I have discussed any further diagnostic evaluation that may be needed or ordered today. We also reviewed her medications today. she has been encouraged to call the office with any questions or concerns that  should arise related to todays visit.    Counseling:    Orders Placed This Encounter  Procedures  . Microscopic Examination  . Urine Culture, Reflex  . MM Digital Screening  . UA/M w/rflx Culture, Routine  . CBC w/Diff/Platelet  . Comprehensive Metabolic Panel (CMET)  . Lipid Panel With LDL/HDL Ratio  . TSH + free T4    Meds ordered this encounter  Medications  . topiramate (TOPAMAX) 25 MG tablet    Sig: Take 1 tablet (25 mg total) by mouth daily.    Dispense:  90 tablet    Refill:  0  . ALPRAZolam (XANAX) 0.25 MG tablet    Sig: Take 1 tablet (0.25 mg total) by mouth at bedtime as needed for anxiety.    Dispense:  30 tablet    Refill:  1    Total time spent: 30 Minutes  Time spent includes review of chart, medications, test results, and follow up plan with the patient.   This patient was seen by Theodoro Grist AGNP-C Collaboration with Dr Lavera Guise as a part of collaborative care agreement   Tanna Furry. [facility] Internal Medicine

## 2020-05-29 ENCOUNTER — Encounter: Payer: Self-pay | Admitting: Hospice and Palliative Medicine

## 2020-05-31 LAB — MICROSCOPIC EXAMINATION
Casts: NONE SEEN /lpf
Epithelial Cells (non renal): >10 /hpf — AB (ref 0–10)

## 2020-05-31 LAB — UA/M W/RFLX CULTURE, ROUTINE
Bilirubin, UA: NEGATIVE
Glucose, UA: NEGATIVE
Nitrite, UA: POSITIVE — AB
Protein,UA: NEGATIVE
RBC, UA: NEGATIVE
Specific Gravity, UA: 1.022 (ref 1.005–1.030)
Urobilinogen, Ur: 0.2 mg/dL (ref 0.2–1.0)
pH, UA: 5 (ref 5.0–7.5)

## 2020-05-31 LAB — URINE CULTURE, REFLEX

## 2020-05-31 NOTE — Progress Notes (Signed)
Why was the culture cancelled?

## 2020-06-01 ENCOUNTER — Other Ambulatory Visit: Payer: Self-pay

## 2020-06-01 ENCOUNTER — Telehealth: Payer: Self-pay

## 2020-06-01 DIAGNOSIS — R300 Dysuria: Secondary | ICD-10-CM

## 2020-06-01 MED ORDER — NITROFURANTOIN MONOHYD MACRO 100 MG PO CAPS: 100.0000 mg | ORAL_CAPSULE | Freq: Two times a day (BID) | ORAL | 0 refills | Status: AC

## 2020-06-01 NOTE — Telephone Encounter (Signed)
lmom call us back

## 2020-06-01 NOTE — Telephone Encounter (Signed)
Spoke with pt that your did showed uti and she also having some symptoms as per taylor send macrobid for 10 days and also put  Repeat UA/culture order in epic

## 2020-06-06 ENCOUNTER — Other Ambulatory Visit: Payer: Self-pay | Admitting: Hospice and Palliative Medicine

## 2020-06-06 DIAGNOSIS — Z1231 Encounter for screening mammogram for malignant neoplasm of breast: Secondary | ICD-10-CM

## 2020-06-20 DIAGNOSIS — R5383 Other fatigue: Secondary | ICD-10-CM | POA: Diagnosis not present

## 2020-06-20 DIAGNOSIS — R300 Dysuria: Secondary | ICD-10-CM | POA: Diagnosis not present

## 2020-06-21 LAB — CBC WITH DIFFERENTIAL/PLATELET
Basophils Absolute: 0 10*3/uL (ref 0.0–0.2)
Basos: 0 %
EOS (ABSOLUTE): 0.1 10*3/uL (ref 0.0–0.4)
Eos: 2 %
Hematocrit: 40.9 % (ref 34.0–46.6)
Hemoglobin: 14 g/dL (ref 11.1–15.9)
Immature Grans (Abs): 0 10*3/uL (ref 0.0–0.1)
Immature Granulocytes: 0 %
Lymphocytes Absolute: 1.1 10*3/uL (ref 0.7–3.1)
Lymphs: 18 %
MCH: 30.8 pg (ref 26.6–33.0)
MCHC: 34.2 g/dL (ref 31.5–35.7)
MCV: 90 fL (ref 79–97)
Monocytes Absolute: 0.5 10*3/uL (ref 0.1–0.9)
Monocytes: 8 %
Neutrophils Absolute: 4.3 10*3/uL (ref 1.4–7.0)
Neutrophils: 72 %
Platelets: 237 10*3/uL (ref 150–450)
RBC: 4.54 x10E6/uL (ref 3.77–5.28)
RDW: 14 % (ref 11.7–15.4)
WBC: 6.1 10*3/uL (ref 3.4–10.8)

## 2020-06-21 LAB — UA/M W/RFLX CULTURE, ROUTINE
Bilirubin, UA: NEGATIVE
Glucose, UA: NEGATIVE
Ketones, UA: NEGATIVE
Leukocytes,UA: NEGATIVE
Nitrite, UA: NEGATIVE
Protein,UA: NEGATIVE
RBC, UA: NEGATIVE
Specific Gravity, UA: 1.006 (ref 1.005–1.030)
Urobilinogen, Ur: 0.2 mg/dL (ref 0.2–1.0)
pH, UA: 7 (ref 5.0–7.5)

## 2020-06-21 LAB — COMPREHENSIVE METABOLIC PANEL
ALT: 19 IU/L (ref 0–32)
AST: 22 IU/L (ref 0–40)
Albumin/Globulin Ratio: 1.8 (ref 1.2–2.2)
Albumin: 4.6 g/dL (ref 3.7–4.7)
Alkaline Phosphatase: 87 IU/L (ref 44–121)
BUN/Creatinine Ratio: 20 (ref 12–28)
BUN: 16 mg/dL (ref 8–27)
Bilirubin Total: 0.5 mg/dL (ref 0.0–1.2)
CO2: 21 mmol/L (ref 20–29)
Calcium: 10.1 mg/dL (ref 8.7–10.3)
Chloride: 102 mmol/L (ref 96–106)
Creatinine, Ser: 0.81 mg/dL (ref 0.57–1.00)
Globulin, Total: 2.6 g/dL (ref 1.5–4.5)
Glucose: 101 mg/dL — ABNORMAL HIGH (ref 65–99)
Potassium: 4.5 mmol/L (ref 3.5–5.2)
Sodium: 139 mmol/L (ref 134–144)
Total Protein: 7.2 g/dL (ref 6.0–8.5)
eGFR: 78 mL/min/{1.73_m2} (ref >59)

## 2020-06-21 LAB — LIPID PANEL WITH LDL/HDL RATIO
Cholesterol, Total: 227 mg/dL — ABNORMAL HIGH (ref 100–199)
HDL: 88 mg/dL (ref >39)
LDL Chol Calc (NIH): 124 mg/dL — ABNORMAL HIGH (ref 0–99)
LDL/HDL Ratio: 1.4 ratio (ref 0.0–3.2)
Triglycerides: 86 mg/dL (ref 0–149)
VLDL Cholesterol Cal: 15 mg/dL (ref 5–40)

## 2020-06-21 LAB — MICROSCOPIC EXAMINATION
Bacteria, UA: NONE SEEN
Casts: NONE SEEN /lpf
Epithelial Cells (non renal): NONE SEEN /hpf (ref 0–10)
RBC, Urine: NONE SEEN /hpf (ref 0–2)
WBC, UA: NONE SEEN /hpf (ref 0–5)

## 2020-06-21 LAB — TSH+FREE T4
Free T4: 1.15 ng/dL (ref 0.82–1.77)
TSH: 1.37 u[IU]/mL (ref 0.450–4.500)

## 2020-06-21 NOTE — Progress Notes (Signed)
Labs reviewed, will discuss at next visit.

## 2020-07-07 ENCOUNTER — Ambulatory Visit (INDEPENDENT_AMBULATORY_CARE_PROVIDER_SITE_OTHER): Payer: Medicare HMO | Admitting: Hospice and Palliative Medicine

## 2020-07-07 ENCOUNTER — Encounter: Payer: Self-pay | Admitting: Hospice and Palliative Medicine

## 2020-07-07 ENCOUNTER — Other Ambulatory Visit: Payer: Self-pay

## 2020-07-07 VITALS — BP 128/76 | HR 64 | Temp 97.3°F | Resp 16 | Ht 63.0 in | Wt 167.4 lb

## 2020-07-07 DIAGNOSIS — F411 Generalized anxiety disorder: Secondary | ICD-10-CM | POA: Diagnosis not present

## 2020-07-07 DIAGNOSIS — Z79899 Other long term (current) drug therapy: Secondary | ICD-10-CM

## 2020-07-07 DIAGNOSIS — R69 Illness, unspecified: Secondary | ICD-10-CM | POA: Diagnosis not present

## 2020-07-07 DIAGNOSIS — I10 Essential (primary) hypertension: Secondary | ICD-10-CM

## 2020-07-07 DIAGNOSIS — E782 Mixed hyperlipidemia: Secondary | ICD-10-CM

## 2020-07-07 LAB — POCT URINE DRUG SCREEN
Methylenedioxyamphetamine: NOT DETECTED
POC Amphetamine UR: NOT DETECTED
POC BENZODIAZEPINES UR: POSITIVE — AB
POC Barbiturate UR: NOT DETECTED
POC Cocaine UR: NOT DETECTED
POC Ecstasy UR: NOT DETECTED
POC Marijuana UR: NOT DETECTED
POC Methadone UR: NOT DETECTED
POC Methamphetamine UR: NOT DETECTED
POC Opiate Ur: NOT DETECTED
POC Oxycodone UR: NOT DETECTED
POC PHENCYCLIDINE UR: NOT DETECTED
POC TRICYCLICS UR: NOT DETECTED

## 2020-07-07 MED ORDER — ALPRAZOLAM 0.25 MG PO TABS: 0.2500 mg | ORAL_TABLET | Freq: Every evening | ORAL | 1 refills | Status: DC | PRN

## 2020-07-07 NOTE — Progress Notes (Signed)
[facility] [place],  [address]  Internal MEDICINE  Office Visit Note  Patient Name: Patricia Frazier  [id number]  [id number]  Date of Service: 07/08/2020  Chief Complaint  Patient presents with  . Depression  . Anxiety  . Gastroesophageal Reflux  . Sleep Apnea  . Hypertension  . Follow-up    Med for weight loss isn't working, gave pt headache, refill request, review blood work    HPI Patient is here for routine follow-up Started on low dose Topamax at last visit for weight loss assistance, unable to tolerate as she feels this caused headaches and did not notice a change in her cravings or appetite Continues to walk and exercise almost daily and does closely monitor what she eats, her biggest pitfall is wine with cheese and crackers Wanting to see how her weight fluctuates without medication Reviewed recent labs--abnormal lipid panel all others stable Total CHO 227, LDL 124   Current Medication: Outpatient Encounter Medications as of 07/07/2020  Medication Sig  . amLODipine (NORVASC) 10 MG tablet Take 1 tablet (10 mg total) by mouth daily.  Marland Kitchen ibuprofen (ADVIL) 200 MG tablet Take 400 mg by mouth every 6 (six) hours as needed.  . Multiple Vitamin (MULTIVITAMIN) tablet Take 1 tablet by mouth daily. chewable  . omeprazole (PRILOSEC OTC) 20 MG tablet Take 1 tablet (20 mg total) by mouth daily.  . pravastatin (PRAVACHOL) 40 MG tablet Take 1 tablet (40 mg total) by mouth at bedtime.  . [DISCONTINUED] ALPRAZolam (XANAX) 0.25 MG tablet Take 1 tablet (0.25 mg total) by mouth at bedtime as needed for anxiety.  . [DISCONTINUED] topiramate (TOPAMAX) 25 MG tablet Take 1 tablet (25 mg total) by mouth daily.  Marland Kitchen ALPRAZolam (XANAX) 0.25 MG tablet Take 1 tablet (0.25 mg total) by mouth at bedtime as needed for anxiety.  . [DISCONTINUED] methocarbamol (ROBAXIN) 500 MG tablet Take 1 tablet (500 mg total) by mouth every 6 (six) hours as needed for muscle spasms.  (Patient not taking: Reported on 07/07/2020)   No facility-administered encounter medications on file as of 07/07/2020.    Surgical History: Past Surgical History:  Procedure Laterality Date  . BREAST CYST ASPIRATION Left yrs ago  . LUMBAR LAMINECTOMY/DECOMPRESSION MICRODISCECTOMY Left 06/18/2018   Procedure: LUMBAR LAMINECTOMY/DECOMPRESSION MICRODISCECTOMY 1 LEVEL, L5-S1  LEFT;  Surgeon: Meade Maw, MD;  Location: [facility] ORS;  Service: Neurosurgery;  Laterality: Left;  . STENT PLACE LEFT URETER ([place] HX)     unsure  of side  . TONSILLECTOMY    . tubiligation      Medical History: Past Medical History:  Diagnosis Date  . Anxiety   . Back pain   . Bulging lumbar disc   . Chest pain   . Depression   . GERD (gastroesophageal reflux disease)   . History of kidney stones   . Hypertension   . Migraine   . Sleep apnea    has never used a cpap machine  . Varicose veins of legs   . Vertigo   . Vision problems     Family History: Family History  Problem Relation Age of Onset  . Hypertension Mother   . Cancer Father   . Hypertension Father   . Hyperlipidemia Father     Social History   Socioeconomic History  . Marital status: Married    Spouse name: Merry Proud  . Number of children: Not on file  . Years of education: Not on file  . Highest education level: Not  on file  Occupational History  . 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 Smoking status: Former Smoker    Types: Cigarettes    Quit date: 2000    Years since quitting: 22.2  . Smokeless tobacco: Never Used  Vaping Use  . Vaping Use: Never used  Substance and Sexual Activity  . Alcohol use: Yes    Comment: socially  . Drug use: No  . Sexual activity: Not Currently  Other Topics Concern  . Not on file  Social History Narrative  . Not on file   Social Determinants of Health   Financial Resource Strain: Not on file  Food Insecurity: Not on file  Transportation Needs:  Not on file  Physical Activity: Not on file  Stress: Not on file  Social Connections: Not on file  Intimate Partner Violence: Not on file      Review of Systems  Constitutional: Negative for chills, diaphoresis and fatigue.  HENT: Negative for ear pain, postnasal drip and sinus pressure.   Eyes: Negative for photophobia, discharge, redness, itching and visual disturbance.  Respiratory: Negative for cough, shortness of breath and wheezing.   Cardiovascular: Negative for chest pain, palpitations and leg swelling.  Gastrointestinal: Negative for abdominal pain, constipation, diarrhea, nausea and vomiting.  Genitourinary: Negative for dysuria and flank pain.  Musculoskeletal: Negative for arthralgias, back pain, gait problem and neck pain.  Skin: Negative for color change.  Allergic/Immunologic: Negative for environmental allergies and food allergies.  Neurological: Negative for dizziness and headaches.  Hematological: Does not bruise/bleed easily.  Psychiatric/Behavioral: Negative for agitation, behavioral problems (depression) and hallucinations.    Vital Signs: BP 128/76   Pulse 64   Temp (!) 97.3 F (36.3 C)   Resp 16   Ht 5\' 3"  (1.6 m)   Wt 167 lb 6.4 oz (75.9 kg)   SpO2 98%   BMI 29.65 kg/m    Physical Exam Vitals reviewed.  Constitutional:      Appearance: Normal appearance. She is normal weight.  Cardiovascular:     Rate and Rhythm: Normal rate and regular rhythm.     Pulses: Normal pulses.     Heart sounds: Normal heart sounds.  Pulmonary:     Effort: Pulmonary effort is normal.     Breath sounds: Normal breath sounds.  Abdominal:     General: Abdomen is flat.     Palpations: Abdomen is soft.  Musculoskeletal:        General: Normal range of motion.     Cervical back: Normal range of motion.  Skin:    General: Skin is warm.  Neurological:     General: No focal deficit present.     Mental Status: She is alert and oriented to person, place, and time.  Mental status is at baseline.  Psychiatric:        Mood and Affect: Mood normal.        Behavior: Behavior normal.        Thought Content: Thought content normal.        Judgment: Judgment normal.    Assessment/Plan: 1. Mixed hyperlipidemia Continue with statin therapy, tolerating well  2. Essential hypertension BP and HR well controlled, continue with management and monitoring  3. GAD (generalized anxiety disorder) Continue with low dose alprazolam for insomnia related to GAD [place] Controlled Substance Database was reviewed by me for overdose risk score (ORS) - ALPRAZolam (XANAX) 0.25 MG tablet; Take 1 tablet (0.25 mg  total) by mouth at bedtime as needed for anxiety.  Dispense: 30 tablet; Refill: 1  4. Encounter for long-term (current) use of medications - POCT Urine Drug Screen  General Counseling: Khole verbalizes understanding of the findings of todays visit and agrees with plan of treatment. I have discussed any further diagnostic evaluation that may be needed or ordered today. We also reviewed her medications today. she has been encouraged to call the office with any questions or concerns that should arise related to todays visit.    Orders Placed This Encounter  Procedures  . POCT Urine Drug Screen    Meds ordered this encounter  Medications  . ALPRAZolam (XANAX) 0.25 MG tablet    Sig: Take 1 tablet (0.25 mg total) by mouth at bedtime as needed for anxiety.    Dispense:  30 tablet    Refill:  1    Do not fill prior to 4/28.    Time spent: 30 Minutes Time spent includes review of chart, medications, test results and follow-up plan with the patient.  This patient was seen by Theodoro Grist AGNP-C in Collaboration with Dr Lavera Guise as a part of collaborative care agreement     Tanna Furry. Mckyle Solanki AGNP-C Internal medicine

## 2020-07-08 ENCOUNTER — Encounter: Payer: Self-pay | Admitting: Hospice and Palliative Medicine

## 2020-07-20 ENCOUNTER — Other Ambulatory Visit: Payer: Self-pay

## 2020-07-20 DIAGNOSIS — I10 Essential (primary) hypertension: Secondary | ICD-10-CM

## 2020-07-20 MED ORDER — AMLODIPINE BESYLATE 10 MG PO TABS: 10.0000 mg | ORAL_TABLET | Freq: Every day | ORAL | 2 refills | Status: DC

## 2020-07-27 ENCOUNTER — Inpatient Hospital Stay: Admission: RE | Admit: 2020-07-27 | Payer: PRIVATE HEALTH INSURANCE | Source: Ambulatory Visit

## 2020-08-17 ENCOUNTER — Ambulatory Visit
Admission: RE | Admit: 2020-08-17 | Discharge: 2020-08-17 | Disposition: A | Discharge status: Home / Self Care | Payer: Medicare HMO | Source: Ambulatory Visit | Attending: Hospice and Palliative Medicine | Admitting: Hospice and Palliative Medicine

## 2020-08-17 ENCOUNTER — Other Ambulatory Visit: Payer: Self-pay

## 2020-08-17 DIAGNOSIS — Z1231 Encounter for screening mammogram for malignant neoplasm of breast: Secondary | ICD-10-CM | POA: Insufficient documentation

## 2020-09-16 ENCOUNTER — Other Ambulatory Visit: Payer: Self-pay | Admitting: Nurse Practitioner

## 2020-09-16 DIAGNOSIS — E782 Mixed hyperlipidemia: Secondary | ICD-10-CM

## 2020-09-21 ENCOUNTER — Other Ambulatory Visit: Payer: Self-pay

## 2020-09-21 DIAGNOSIS — E782 Mixed hyperlipidemia: Secondary | ICD-10-CM

## 2020-09-21 MED ORDER — PRAVASTATIN SODIUM 40 MG PO TABS: 40.0000 mg | ORAL_TABLET | Freq: Every day | ORAL | 3 refills | Status: DC

## 2020-09-22 DIAGNOSIS — H401231 Low-tension glaucoma, bilateral, mild stage: Secondary | ICD-10-CM | POA: Diagnosis not present

## 2020-09-22 DIAGNOSIS — H2513 Age-related nuclear cataract, bilateral: Secondary | ICD-10-CM | POA: Diagnosis not present

## 2020-09-22 DIAGNOSIS — H5213 Myopia, bilateral: Secondary | ICD-10-CM | POA: Diagnosis not present

## 2020-09-22 DIAGNOSIS — H524 Presbyopia: Secondary | ICD-10-CM | POA: Diagnosis not present

## 2020-09-23 ENCOUNTER — Other Ambulatory Visit: Payer: Self-pay | Admitting: Nurse Practitioner

## 2020-09-23 DIAGNOSIS — K219 Gastro-esophageal reflux disease without esophagitis: Secondary | ICD-10-CM

## 2020-09-29 ENCOUNTER — Telehealth: Payer: Self-pay

## 2020-09-29 NOTE — Telephone Encounter (Signed)
Left vm for patient to return call to reschedule 10/26/20 appointment-Toni

## 2020-10-12 ENCOUNTER — Ambulatory Visit: Payer: Self-pay | Admitting: Hospice and Palliative Medicine

## 2020-10-19 DIAGNOSIS — H5213 Myopia, bilateral: Secondary | ICD-10-CM | POA: Diagnosis not present

## 2020-10-19 DIAGNOSIS — H524 Presbyopia: Secondary | ICD-10-CM | POA: Diagnosis not present

## 2020-10-19 DIAGNOSIS — H2513 Age-related nuclear cataract, bilateral: Secondary | ICD-10-CM | POA: Diagnosis not present

## 2020-10-19 DIAGNOSIS — H401231 Low-tension glaucoma, bilateral, mild stage: Secondary | ICD-10-CM | POA: Diagnosis not present

## 2020-10-26 ENCOUNTER — Ambulatory Visit: Payer: Self-pay | Admitting: Internal Medicine

## 2020-10-26 ENCOUNTER — Ambulatory Visit: Payer: Self-pay | Admitting: Hospice and Palliative Medicine

## 2020-11-02 ENCOUNTER — Other Ambulatory Visit: Payer: Self-pay

## 2020-11-02 ENCOUNTER — Encounter: Payer: Self-pay | Admitting: Nurse Practitioner

## 2020-11-02 ENCOUNTER — Ambulatory Visit (INDEPENDENT_AMBULATORY_CARE_PROVIDER_SITE_OTHER): Payer: Medicare HMO | Admitting: Nurse Practitioner

## 2020-11-02 VITALS — BP 120/82 | HR 56 | Temp 98.1°F | Resp 16 | Ht 63.0 in | Wt 170.0 lb

## 2020-11-02 DIAGNOSIS — Z79899 Other long term (current) drug therapy: Secondary | ICD-10-CM | POA: Diagnosis not present

## 2020-11-02 DIAGNOSIS — F411 Generalized anxiety disorder: Secondary | ICD-10-CM

## 2020-11-02 DIAGNOSIS — E782 Mixed hyperlipidemia: Secondary | ICD-10-CM | POA: Diagnosis not present

## 2020-11-02 DIAGNOSIS — I10 Essential (primary) hypertension: Secondary | ICD-10-CM | POA: Diagnosis not present

## 2020-11-02 DIAGNOSIS — R69 Illness, unspecified: Secondary | ICD-10-CM | POA: Diagnosis not present

## 2020-11-02 MED ORDER — EZETIMIBE 10 MG PO TABS: 10.0000 mg | ORAL_TABLET | Freq: Every day | ORAL | 2 refills | Status: DC

## 2020-11-02 MED ORDER — PRAVASTATIN SODIUM 80 MG PO TABS: 80.0000 mg | ORAL_TABLET | Freq: Every day | ORAL | 1 refills | Status: DC

## 2020-11-02 MED ORDER — ALPRAZOLAM 0.25 MG PO TABS: 0.1250 mg | ORAL_TABLET | Freq: Every day | ORAL | 2 refills | Status: DC | PRN

## 2020-11-02 NOTE — Progress Notes (Signed)
[facility] [place], [place] [address]  Internal MEDICINE  Office Visit Note  Patient Name: Patricia Frazier  [id number]  [id number]  Date of Service: 11/02/2020  Chief Complaint  Patient presents with   Follow-up   Gastroesophageal Reflux   Depression   Hypertension    HPI Birute presents for a follow-up visit for gastroesophageal reflux, depression, and hypertension. She also has a history of anxiety, depression, and sleep apnea without CPAP use. Her GERD is well controlled with current medications. She reports that her depression has improved  with current medications. Her blood pressure was slightly elevated today, but was wnl when rechecked, see vitals.   Fish oil 1000 mg daily Zetia 10  ORS 210 Current Medication: Outpatient Encounter Medications as of 11/02/2020  Medication Sig   ezetimibe (ZETIA) 10 MG tablet Take 1 tablet (10 mg total) by mouth daily.   pravastatin (PRAVACHOL) 80 MG tablet Take 1 tablet (80 mg total) by mouth daily.   ALPRAZolam (XANAX) 0.25 MG tablet Take 0.5 tablets (0.125 mg total) by mouth daily as needed for anxiety.   amLODipine (NORVASC) 10 MG tablet Take 1 tablet (10 mg total) by mouth daily.   ibuprofen (ADVIL) 200 MG tablet Take 400 mg by mouth every 6 (six) hours as needed.   Multiple Vitamin (MULTIVITAMIN) tablet Take 1 tablet by mouth daily. chewable   omeprazole (PRILOSEC OTC) 20 MG tablet Take 1 tablet (20 mg total) by mouth daily.   [DISCONTINUED] ALPRAZolam (XANAX) 0.25 MG tablet Take 1 tablet (0.25 mg total) by mouth at bedtime as needed for anxiety.   [DISCONTINUED] pravastatin (PRAVACHOL) 40 MG tablet Take 1 tablet (40 mg total) by mouth at bedtime.   No facility-administered encounter medications on file as of 11/02/2020.    Surgical History: Past Surgical History:  Procedure Laterality Date   BREAST CYST ASPIRATION Left yrs ago   LUMBAR LAMINECTOMY/DECOMPRESSION MICRODISCECTOMY Left 06/18/2018    Procedure: LUMBAR LAMINECTOMY/DECOMPRESSION MICRODISCECTOMY 1 LEVEL, L5-S1  LEFT;  Surgeon: Meade Maw, MD;  Location: [facility] ORS;  Service: Neurosurgery;  Laterality: Left;   STENT PLACE LEFT URETER ([place] HX)     unsure  of side   TONSILLECTOMY     tubiligation      Medical History: Past Medical History:  Diagnosis Date   Anxiety    Back pain    Bulging lumbar disc    Chest pain    Depression    GERD (gastroesophageal reflux disease)    History of kidney stones    Hypertension    Migraine    Sleep apnea    has never used a cpap machine   Varicose veins of legs    Vertigo    Vision problems     Family History: Family History  Problem Relation Age of Onset   Hypertension Mother    Cancer Father    Hypertension Father    Hyperlipidemia Father    Breast cancer Neg Hx     Social History   Socioeconomic History   Marital status: Married    Spouse name: Merry Proud   Number of children: Not on file   Years of education: Not on file   Highest education level: Not on file  Occupational History   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    Comment: [employer]  Tobacco Use   Smoking status: Former    Types: Cigarettes    Quit date: 2000    Years since quitting: 22.6   Smokeless  tobacco: Never  Vaping Use   Vaping Use: Never used  Substance and Sexual Activity   Alcohol use: Yes    Comment: socially   Drug use: No   Sexual activity: Not Currently  Other Topics Concern   Not on file  Social History Narrative   Not on file   Social Determinants of Health   Financial Resource Strain: Not on file  Food Insecurity: Not on file  Transportation Needs: Not on file  Physical Activity: Not on file  Stress: Not on file  Social Connections: Not on file  Intimate Partner Violence: Not on file      Review of Systems  Constitutional:  Negative for chills, fatigue and unexpected weight change.  HENT:  Negative for congestion, rhinorrhea, sneezing and sore throat.    Eyes:  Negative for redness.  Respiratory:  Negative for cough, chest tightness and shortness of breath.   Cardiovascular:  Negative for chest pain and palpitations.  Gastrointestinal:  Negative for abdominal pain, constipation, diarrhea, nausea and vomiting.  Genitourinary:  Negative for dysuria and frequency.  Musculoskeletal:  Negative for arthralgias, back pain, joint swelling and neck pain.  Skin:  Negative for rash.  Neurological: Negative.  Negative for tremors and numbness.  Hematological:  Negative for adenopathy. Does not bruise/bleed easily.  Psychiatric/Behavioral:  Positive for behavioral problems (Depression, symptoms are under control). Negative for sleep disturbance and suicidal ideas. The patient is not nervous/anxious.    Vital Signs: BP 120/82 Comment: 142/74  Pulse (!) 56   Temp 98.1 F (36.7 C)   Resp 16   Ht '5\' 3"'$  (1.6 m)   Wt 170 lb (77.1 kg)   SpO2 92%   BMI 30.11 kg/m    Physical Exam Vitals reviewed.  Constitutional:      General: She is not in acute distress.    Appearance: Normal appearance. She is not ill-appearing.  HENT:     Head: Normocephalic and atraumatic.  Cardiovascular:     Rate and Rhythm: Normal rate and regular rhythm.  Pulmonary:     Effort: Pulmonary effort is normal. No respiratory distress.  Skin:    General: Skin is warm and dry.     Capillary Refill: Capillary refill takes less than 2 seconds.  Neurological:     Mental Status: She is alert and oriented to person, place, and time.  Psychiatric:        Mood and Affect: Mood normal.        Behavior: Behavior normal.    Assessment/Plan: 1. GAD (generalized anxiety disorder) Stable with current medications. Patient takes only a 1/2 tablet daily but occasionally her anxiety may be severe enough that she takes a whole tablet. Refill ordered.  Controlled Substance Database was reviewed by me for overdose risk score (ORS). ORS is 210.  UDS not done today because patient reports  that she ran out of the alprazolam and has been out of it for several days. Last fill was 09/16/20. - ALPRAZolam (XANAX) 0.25 MG tablet; Take 0.5 tablets (0.125 mg total) by mouth daily as needed for anxiety.  Dispense: 30 tablet; Refill: 2  2. Essential hypertension Stable with current medication, taking amlodipine '10mg'$  daily.   3. Mixed hyperlipidemia Last lipid panel was drawn in March: total cholesterol 227, HDL 88, LDL 124. Estimated 10-year ASCVD risk is 18.4% for which moderate statin therapy is recommended. She has been taking pravastatin 40 mg daily and tolerating it well. Discussed increasing pravastatin dose to 80 mg daily and  adding ezetimibe 10 mg daily. Discussed possible side effects of both medications and potential benefits to be gained from medication adjustment. Patient is agreeable to this plan. Will recheck lipid panel at next office visit.  - pravastatin (PRAVACHOL) 80 MG tablet; Take 1 tablet (80 mg total) by mouth daily.  Dispense: 90 tablet; Refill: 1 - ezetimibe (ZETIA) 10 MG tablet; Take 1 tablet (10 mg total) by mouth daily.  Dispense: 90 tablet; Refill: 2  4. Encounter for long-term (current) use of medications Patient has been taking alprazolam but has been out of the medication with no refills for several days. She reports that she takes 1/2 of the 0.5-mg-tablet daily as needed for anxiety. The prescription was changed to reflect this. Occasionally she may need a whole tablet if her anxiety is worse. UDS not done today since the medication is not in her system. UDS will be done at her follow up visit. No more refills will be sent until UDS is updated.    General Counseling: maggy rones understanding of the findings of todays visit and agrees with plan of treatment. I have discussed any further diagnostic evaluation that may be needed or ordered today. We also reviewed her medications today. she has been encouraged to call the office with any questions or concerns  that should arise related to todays visit.    No orders of the defined types were placed in this encounter.   Meds ordered this encounter  Medications   pravastatin (PRAVACHOL) 80 MG tablet    Sig: Take 1 tablet (80 mg total) by mouth daily.    Dispense:  90 tablet    Refill:  1   ezetimibe (ZETIA) 10 MG tablet    Sig: Take 1 tablet (10 mg total) by mouth daily.    Dispense:  90 tablet    Refill:  2   ALPRAZolam (XANAX) 0.25 MG tablet    Sig: Take 0.5 tablets (0.125 mg total) by mouth daily as needed for anxiety.    Dispense:  30 tablet    Refill:  2    Fill on 11/07/20    Return in about 3 months (around 02/02/2021) for F/U, med refill, BP check, Marcellina Jonsson PCP. Must have UDS done at next office visit.   Total time spent:30 Minutes Time spent includes review of chart, medications, test results, and follow up plan with the patient.  [place] Controlled Substance Database was reviewed by me.  This patient was seen by Jonetta Osgood, FNP-C in collaboration with Dr. Clayborn Bigness as a part of collaborative care agreement.   Lige Lakeman R. Valetta Fuller, MSN, FNP-C Internal medicine

## 2020-12-19 ENCOUNTER — Telehealth: Payer: Self-pay

## 2020-12-19 ENCOUNTER — Other Ambulatory Visit: Payer: Self-pay | Admitting: Nurse Practitioner

## 2020-12-19 DIAGNOSIS — E782 Mixed hyperlipidemia: Secondary | ICD-10-CM

## 2020-12-19 MED ORDER — PRAVASTATIN SODIUM 40 MG PO TABS
60.0000 mg | ORAL_TABLET | Freq: Every day | ORAL | 1 refills | Status: DC
Start: 2020-12-19 — End: 2021-02-21

## 2020-12-19 NOTE — Telephone Encounter (Signed)
Pt advised that we change dosage and send to [facility] and continue taking zetia

## 2020-12-19 NOTE — Telephone Encounter (Signed)
Prescription has been changed and sent in as requested, yes she can continue ezetimibe as well.

## 2021-01-18 DIAGNOSIS — H52223 Regular astigmatism, bilateral: Secondary | ICD-10-CM | POA: Diagnosis not present

## 2021-01-18 DIAGNOSIS — H524 Presbyopia: Secondary | ICD-10-CM | POA: Diagnosis not present

## 2021-01-18 DIAGNOSIS — H401231 Low-tension glaucoma, bilateral, mild stage: Secondary | ICD-10-CM | POA: Diagnosis not present

## 2021-01-18 DIAGNOSIS — H2513 Age-related nuclear cataract, bilateral: Secondary | ICD-10-CM | POA: Diagnosis not present

## 2021-01-18 DIAGNOSIS — H5213 Myopia, bilateral: Secondary | ICD-10-CM | POA: Diagnosis not present

## 2021-01-26 ENCOUNTER — Ambulatory Visit: Payer: Medicare HMO | Admitting: Nurse Practitioner

## 2021-02-10 ENCOUNTER — Telehealth: Payer: Self-pay

## 2021-02-10 ENCOUNTER — Other Ambulatory Visit: Payer: Self-pay | Admitting: Nurse Practitioner

## 2021-02-10 DIAGNOSIS — E782 Mixed hyperlipidemia: Secondary | ICD-10-CM

## 2021-02-10 NOTE — Telephone Encounter (Signed)
Patient called and pushed appt to 02/21/2021 she stated her medications were changed and she will need lab work orders sent to [place] so she can go Monday to get these done to have them in by her scheduled appt. Thanks in advance!

## 2021-02-10 NOTE — Telephone Encounter (Signed)
Pt aware lipid panel was sent to [facility]

## 2021-02-13 ENCOUNTER — Ambulatory Visit: Payer: Medicare HMO | Admitting: Nurse Practitioner

## 2021-02-13 DIAGNOSIS — D2272 Melanocytic nevi of left lower limb, including hip: Secondary | ICD-10-CM | POA: Diagnosis not present

## 2021-02-13 DIAGNOSIS — L820 Inflamed seborrheic keratosis: Secondary | ICD-10-CM | POA: Diagnosis not present

## 2021-02-13 DIAGNOSIS — L538 Other specified erythematous conditions: Secondary | ICD-10-CM | POA: Diagnosis not present

## 2021-02-13 DIAGNOSIS — D225 Melanocytic nevi of trunk: Secondary | ICD-10-CM | POA: Diagnosis not present

## 2021-02-13 DIAGNOSIS — D2261 Melanocytic nevi of right upper limb, including shoulder: Secondary | ICD-10-CM | POA: Diagnosis not present

## 2021-02-13 DIAGNOSIS — L570 Actinic keratosis: Secondary | ICD-10-CM | POA: Diagnosis not present

## 2021-02-13 DIAGNOSIS — L298 Other pruritus: Secondary | ICD-10-CM | POA: Diagnosis not present

## 2021-02-13 DIAGNOSIS — D2262 Melanocytic nevi of left upper limb, including shoulder: Secondary | ICD-10-CM | POA: Diagnosis not present

## 2021-02-13 DIAGNOSIS — R208 Other disturbances of skin sensation: Secondary | ICD-10-CM | POA: Diagnosis not present

## 2021-02-13 DIAGNOSIS — D485 Neoplasm of uncertain behavior of skin: Secondary | ICD-10-CM | POA: Diagnosis not present

## 2021-02-14 DIAGNOSIS — E782 Mixed hyperlipidemia: Secondary | ICD-10-CM | POA: Diagnosis not present

## 2021-02-15 LAB — LIPID PANEL
Chol/HDL Ratio: 2.2 ratio (ref 0.0–4.4)
Cholesterol, Total: 195 mg/dL (ref 100–199)
HDL: 89 mg/dL (ref >39)
LDL Chol Calc (NIH): 95 mg/dL (ref 0–99)
Triglycerides: 62 mg/dL (ref 0–149)
VLDL Cholesterol Cal: 11 mg/dL (ref 5–40)

## 2021-02-21 ENCOUNTER — Ambulatory Visit (INDEPENDENT_AMBULATORY_CARE_PROVIDER_SITE_OTHER): Payer: Medicare HMO | Admitting: Nurse Practitioner

## 2021-02-21 ENCOUNTER — Other Ambulatory Visit: Payer: Self-pay

## 2021-02-21 ENCOUNTER — Other Ambulatory Visit: Payer: Self-pay | Admitting: Nurse Practitioner

## 2021-02-21 ENCOUNTER — Telehealth: Payer: Self-pay

## 2021-02-21 ENCOUNTER — Encounter: Payer: Self-pay | Admitting: Nurse Practitioner

## 2021-02-21 VITALS — BP 128/78 | HR 75 | Temp 97.8°F | Resp 16 | Ht 63.0 in | Wt 171.0 lb

## 2021-02-21 DIAGNOSIS — K219 Gastro-esophageal reflux disease without esophagitis: Secondary | ICD-10-CM | POA: Diagnosis not present

## 2021-02-21 DIAGNOSIS — F411 Generalized anxiety disorder: Secondary | ICD-10-CM | POA: Diagnosis not present

## 2021-02-21 DIAGNOSIS — E782 Mixed hyperlipidemia: Secondary | ICD-10-CM | POA: Diagnosis not present

## 2021-02-21 DIAGNOSIS — R69 Illness, unspecified: Secondary | ICD-10-CM | POA: Diagnosis not present

## 2021-02-21 MED ORDER — ALPRAZOLAM 0.25 MG PO TABS: 0.1250 mg | ORAL_TABLET | Freq: Every day | ORAL | 2 refills | Status: DC | PRN

## 2021-02-21 MED ORDER — OMEPRAZOLE MAGNESIUM 20 MG PO TBEC: 20.0000 mg | DELAYED_RELEASE_TABLET | Freq: Every day | ORAL | 1 refills | Status: DC

## 2021-02-21 MED ORDER — PRAVASTATIN SODIUM 40 MG PO TABS
40.0000 mg | ORAL_TABLET | Freq: Every day | ORAL | 1 refills | Status: DC
Start: 2021-02-21 — End: 2021-03-09

## 2021-02-21 NOTE — Progress Notes (Signed)
[facility] [place], [place] [address]  Internal MEDICINE  Office Visit Note  Patient Name: Patricia Frazier  [id number]  [id number]  Date of Service: 02/21/2021  Chief Complaint  Patient presents with   Follow-up   Depression   Hypertension   Gastroesophageal Reflux   Anxiety    HPI Randall presents for a follow up for hypertension, anxiety, and medication refills. Her lipids are improved and all values are normal now. She has been taking 1 and a 1/2 tablets but she wants to go back to taking 1 tablet daily. She needs refills of xanax.  She is seeing a dermatologist and had precancerous lesions on her skin that were removed.  She reports also that she hit her head this morning and denies any vision changes, loss of consciousness, change in level of consciousness or any confusion.  She does report that she has a mild headache.    Current Medication: Outpatient Encounter Medications as of 02/21/2021  Medication Sig   ALPRAZolam (XANAX) 0.25 MG tablet Take 0.5 tablets (0.125 mg total) by mouth daily as needed for anxiety.   amLODipine (NORVASC) 10 MG tablet Take 1 tablet (10 mg total) by mouth daily.   ezetimibe (ZETIA) 10 MG tablet Take 1 tablet (10 mg total) by mouth daily.   ibuprofen (ADVIL) 200 MG tablet Take 400 mg by mouth every 6 (six) hours as needed.   Multiple Vitamin (MULTIVITAMIN) tablet Take 1 tablet by mouth daily. chewable   [DISCONTINUED] ALPRAZolam (XANAX) 0.25 MG tablet Take 0.5 tablets (0.125 mg total) by mouth daily as needed for anxiety.   [DISCONTINUED] omeprazole (PRILOSEC OTC) 20 MG tablet Take 1 tablet (20 mg total) by mouth daily.   [DISCONTINUED] omeprazole (PRILOSEC OTC) 20 MG tablet Take 1-2 tablets (20-40 mg total) by mouth daily. For acid reflux   [DISCONTINUED] omeprazole (PRILOSEC) 20 MG capsule Take 20 mg by mouth daily.   [DISCONTINUED] pravastatin (PRAVACHOL) 40 MG tablet Take 1.5 tablets (60 mg total) by mouth daily.    [DISCONTINUED] pravastatin (PRAVACHOL) 40 MG tablet Take 1 tablet (40 mg total) by mouth daily.   No facility-administered encounter medications on file as of 02/21/2021.    Surgical History: Past Surgical History:  Procedure Laterality Date   BREAST CYST ASPIRATION Left yrs ago   LUMBAR LAMINECTOMY/DECOMPRESSION MICRODISCECTOMY Left 06/18/2018   Procedure: LUMBAR LAMINECTOMY/DECOMPRESSION MICRODISCECTOMY 1 LEVEL, L5-S1  LEFT;  Surgeon: Meade Maw, MD;  Location: [facility] ORS;  Service: Neurosurgery;  Laterality: Left;   STENT PLACE LEFT URETER ([place] HX)     unsure  of side   TONSILLECTOMY     tubiligation      Medical History: Past Medical History:  Diagnosis Date   Anxiety    Back pain    Bulging lumbar disc    Chest pain    Depression    GERD (gastroesophageal reflux disease)    History of kidney stones    Hypertension    Migraine    Sleep apnea    has never used a cpap machine   Varicose veins of legs    Vertigo    Vision problems     Family History: Family History  Problem Relation Age of Onset   Hypertension Mother    Cancer Father    Hypertension Father    Hyperlipidemia Father    Breast cancer Neg Hx     Social History   Socioeconomic History   Marital status: Married    Spouse name:  Merry Proud   Number of children: Not on file   Years of education: Not on file   Highest education level: Not on file  Occupational History   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    Comment: [employer]  Tobacco Use   Smoking status: Former    Types: Cigarettes    Quit date: 2000    Years since quitting: 22.9   Smokeless tobacco: Never  Vaping Use   Vaping Use: Never used  Substance and Sexual Activity   Alcohol use: Yes    Comment: socially   Drug use: No   Sexual activity: Not Currently  Other Topics Concern   Not on file  Social History Narrative   Not on file   Social Determinants of Health   Financial Resource Strain: Not on file  Food Insecurity:  Not on file  Transportation Needs: Not on file  Physical Activity: Not on file  Stress: Not on file  Social Connections: Not on file  Intimate Partner Violence: Not on file      Review of Systems  Constitutional:  Negative for chills, fatigue and unexpected weight change.  HENT:  Negative for congestion, rhinorrhea, sneezing and sore throat.   Eyes:  Negative for redness.  Respiratory: Negative.  Negative for cough, chest tightness and shortness of breath.   Cardiovascular: Negative.  Negative for chest pain and palpitations.  Gastrointestinal:  Negative for abdominal pain, constipation, diarrhea, nausea and vomiting.  Genitourinary:  Negative for dysuria and frequency.  Musculoskeletal:  Negative for arthralgias, back pain, joint swelling and neck pain.  Skin:  Negative for rash.  Neurological:  Positive for headaches. Negative for tremors and numbness.  Hematological:  Negative for adenopathy. Does not bruise/bleed easily.  Psychiatric/Behavioral:  Negative for behavioral problems (Depression), sleep disturbance and suicidal ideas. The patient is not nervous/anxious.    Vital Signs: BP 128/78   Pulse 75   Temp 97.8 F (36.6 C)   Resp 16   Ht 5\' 3"  (1.6 m)   Wt 171 lb (77.6 kg)   SpO2 96%   BMI 30.29 kg/m    Physical Exam Vitals reviewed.  Constitutional:      General: She is not in acute distress.    Appearance: Normal appearance. She is obese. She is not ill-appearing.  HENT:     Head: Normocephalic and atraumatic.  Eyes:     Pupils: Pupils are equal, round, and reactive to light.  Cardiovascular:     Rate and Rhythm: Normal rate and regular rhythm.  Pulmonary:     Effort: Pulmonary effort is normal. No respiratory distress.  Neurological:     Mental Status: She is alert and oriented to person, place, and time.     Cranial Nerves: No cranial nerve deficit.     Coordination: Coordination normal.     Gait: Gait normal.  Psychiatric:        Mood and Affect:  Mood normal.        Behavior: Behavior normal.       Assessment/Plan: 1. Gastro-esophageal reflux disease without esophagitis Prilosec refilled, stable with medication  2. Mixed hyperlipidemia Takes pravastatin, no change, continue as prescribed, refill ordered.   3. GAD (generalized anxiety disorder) Stable, refill ordered.  - ALPRAZolam (XANAX) 0.25 MG tablet; Take 0.5 tablets (0.125 mg total) by mouth daily as needed for anxiety.  Dispense: 30 tablet; Refill: 2   General Counseling: Laquanda verbalizes understanding of the findings of todays visit and agrees with plan of treatment.  I have discussed any further diagnostic evaluation that may be needed or ordered today. We also reviewed her medications today. she has been encouraged to call the office with any questions or concerns that should arise related to todays visit.    No orders of the defined types were placed in this encounter.   Meds ordered this encounter  Medications   DISCONTD: pravastatin (PRAVACHOL) 40 MG tablet    Sig: Take 1 tablet (40 mg total) by mouth daily.    Dispense:  90 tablet    Refill:  1   ALPRAZolam (XANAX) 0.25 MG tablet    Sig: Take 0.5 tablets (0.125 mg total) by mouth daily as needed for anxiety.    Dispense:  30 tablet    Refill:  2   DISCONTD: omeprazole (PRILOSEC OTC) 20 MG tablet    Sig: Take 1-2 tablets (20-40 mg total) by mouth daily. For acid reflux    Dispense:  180 tablet    Refill:  1    Please note increased dose and # of tablets    Return in about 6 months (around 08/21/2021) for F/U, Sheray Grist PCP.   Total time spent:30 Minutes Time spent includes review of chart, medications, test results, and follow up plan with the patient.   [place] Controlled Substance Database was reviewed by me.  This patient was seen by Jonetta Osgood, FNP-C in collaboration with Dr. Clayborn Bigness as a part of collaborative care agreement.   Brogan England R. Valetta Fuller, MSN, FNP-C Internal medicine

## 2021-02-21 NOTE — Telephone Encounter (Signed)
Lmom to call us back about omeprazole 20 mg take 1-2 tab po is not covered either is covered 40 mg daily or 20mg  1 tab po daily

## 2021-02-24 ENCOUNTER — Telehealth: Payer: Self-pay

## 2021-02-24 ENCOUNTER — Other Ambulatory Visit: Payer: Self-pay

## 2021-02-24 DIAGNOSIS — K219 Gastro-esophageal reflux disease without esophagitis: Secondary | ICD-10-CM

## 2021-02-24 MED ORDER — OMEPRAZOLE 40 MG PO CPDR: 40.0000 mg | DELAYED_RELEASE_CAPSULE | Freq: Every day | ORAL | 1 refills | Status: DC

## 2021-02-24 NOTE — Telephone Encounter (Signed)
Pt called that she need pres for omeprazole send for 90 days pt advised that we change to 90 days

## 2021-03-09 ENCOUNTER — Other Ambulatory Visit: Payer: Self-pay

## 2021-03-09 ENCOUNTER — Telehealth: Payer: Self-pay

## 2021-03-09 DIAGNOSIS — E782 Mixed hyperlipidemia: Secondary | ICD-10-CM

## 2021-03-09 MED ORDER — PRAVASTATIN SODIUM 40 MG PO TABS: 40.0000 mg | ORAL_TABLET | Freq: Every day | ORAL | 1 refills | Status: DC

## 2021-03-09 NOTE — Telephone Encounter (Signed)
Pt advised that we send pravastatin  to [facility]

## 2021-03-22 ENCOUNTER — Encounter: Payer: Self-pay | Admitting: Nurse Practitioner

## 2021-04-20 DIAGNOSIS — H52223 Regular astigmatism, bilateral: Secondary | ICD-10-CM | POA: Diagnosis not present

## 2021-04-20 DIAGNOSIS — H524 Presbyopia: Secondary | ICD-10-CM | POA: Diagnosis not present

## 2021-04-20 DIAGNOSIS — H2513 Age-related nuclear cataract, bilateral: Secondary | ICD-10-CM | POA: Diagnosis not present

## 2021-04-20 DIAGNOSIS — H401231 Low-tension glaucoma, bilateral, mild stage: Secondary | ICD-10-CM | POA: Diagnosis not present

## 2021-04-20 DIAGNOSIS — H5213 Myopia, bilateral: Secondary | ICD-10-CM | POA: Diagnosis not present

## 2021-05-18 ENCOUNTER — Ambulatory Visit (INDEPENDENT_AMBULATORY_CARE_PROVIDER_SITE_OTHER): Payer: Medicare HMO | Admitting: Nurse Practitioner

## 2021-05-18 ENCOUNTER — Other Ambulatory Visit: Payer: Self-pay

## 2021-05-18 ENCOUNTER — Encounter: Payer: Self-pay | Admitting: Nurse Practitioner

## 2021-05-18 VITALS — BP 126/72 | HR 62 | Temp 98.4°F | Resp 16 | Ht 63.0 in | Wt 175.2 lb

## 2021-05-18 DIAGNOSIS — I10 Essential (primary) hypertension: Secondary | ICD-10-CM

## 2021-05-18 DIAGNOSIS — F411 Generalized anxiety disorder: Secondary | ICD-10-CM

## 2021-05-18 DIAGNOSIS — E039 Hypothyroidism, unspecified: Secondary | ICD-10-CM | POA: Diagnosis not present

## 2021-05-18 DIAGNOSIS — E559 Vitamin D deficiency, unspecified: Secondary | ICD-10-CM

## 2021-05-18 DIAGNOSIS — Z0001 Encounter for general adult medical examination with abnormal findings: Secondary | ICD-10-CM

## 2021-05-18 DIAGNOSIS — E782 Mixed hyperlipidemia: Secondary | ICD-10-CM

## 2021-05-18 DIAGNOSIS — R300 Dysuria: Secondary | ICD-10-CM

## 2021-05-18 DIAGNOSIS — R69 Illness, unspecified: Secondary | ICD-10-CM | POA: Diagnosis not present

## 2021-05-18 LAB — POCT URINALYSIS DIPSTICK
Bilirubin, UA: NEGATIVE
Blood, UA: NEGATIVE
Glucose, UA: NEGATIVE
Leukocytes, UA: NEGATIVE
Nitrite, UA: NEGATIVE
Protein, UA: NEGATIVE
Spec Grav, UA: <=1.005 — AB (ref 1.010–1.025)
Urobilinogen, UA: 0.2 E.U./dL
pH, UA: 6 (ref 5.0–8.0)

## 2021-05-18 MED ORDER — DULOXETINE HCL 30 MG PO CPEP: 30.0000 mg | ORAL_CAPSULE | Freq: Every day | ORAL | 2 refills | Status: DC

## 2021-05-18 MED ORDER — DULOXETINE HCL 60 MG PO CPEP: 60.0000 mg | ORAL_CAPSULE | Freq: Every day | ORAL | 1 refills | Status: DC

## 2021-05-18 MED ORDER — OMEPRAZOLE 20 MG PO CPDR: 20.0000 mg | DELAYED_RELEASE_CAPSULE | Freq: Every day | ORAL | 1 refills | Status: DC

## 2021-05-18 MED ORDER — AMLODIPINE BESYLATE 10 MG PO TABS: 10.0000 mg | ORAL_TABLET | Freq: Every day | ORAL | 2 refills | Status: DC

## 2021-05-18 MED ORDER — EZETIMIBE 10 MG PO TABS: 10.0000 mg | ORAL_TABLET | Freq: Every day | ORAL | 2 refills | Status: DC

## 2021-05-18 NOTE — Progress Notes (Cosign Needed)
[name] [place], [place] [address]  Internal MEDICINE  Office Visit Note  Patient Name: Patricia Frazier  [id number]  [id number]  Date of Service: 05/18/2021  Chief Complaint  Patient presents with   Medicare Wellness    Stomach pain, black stool when its bad   Depression   Gastroesophageal Reflux   Hypertension   Anxiety   Urinary Tract Infection    Frequency, urgency, pelvic pressure    HPI Patricia Frazier presents for an annual well visit and physical exam.  She is a well-appearing 71 year old female with hypertension, GERD, and lumbar radiculopathy.  She reports feeling like she is having a UTI.  She has urinary frequency, urgency, and pelvic pressure. She also reports having intermittent abdominal cramping and that her stool is black and tarry when the cramping is really bad. Her stools are only black and tarry when she takes pepto bismol.  She is due for routine labs. Her last mammogram was in may last year. She is due for colonoscopy in 2027. She wants the shingles and pneumonia vaccine.     Current Medication: Outpatient Encounter Medications as of 05/18/2021  Medication Sig   DULoxetine (CYMBALTA) 30 MG capsule Take 1 capsule (30 mg total) by mouth daily.   ibuprofen (ADVIL) 200 MG tablet Take 400 mg by mouth every 6 (six) hours as needed.   Multiple Vitamin (MULTIVITAMIN) tablet Take 1 tablet by mouth daily. chewable   pravastatin (PRAVACHOL) 40 MG tablet Take 1 tablet (40 mg total) by mouth daily.   [DISCONTINUED] ALPRAZolam (XANAX) 0.25 MG tablet Take 0.5 tablets (0.125 mg total) by mouth daily as needed for anxiety.   [DISCONTINUED] amLODipine (NORVASC) 10 MG tablet Take 1 tablet (10 mg total) by mouth daily.   [DISCONTINUED] DULoxetine (CYMBALTA) 60 MG capsule Take 1 capsule (60 mg total) by mouth daily.   [DISCONTINUED] ezetimibe (ZETIA) 10 MG tablet Take 1 tablet (10 mg total) by mouth daily.   [DISCONTINUED] omeprazole (PRILOSEC) 20 MG  capsule Take 1 capsule (20 mg total) by mouth daily.   [DISCONTINUED] omeprazole (PRILOSEC) 40 MG capsule Take 1 capsule (40 mg total) by mouth daily.   amLODipine (NORVASC) 10 MG tablet Take 1 tablet (10 mg total) by mouth daily.   ezetimibe (ZETIA) 10 MG tablet Take 1 tablet (10 mg total) by mouth daily.   [DISCONTINUED] omeprazole (PRILOSEC OTC) 20 MG tablet Take 1-2 tablets (20-40 mg total) by mouth daily. For acid reflux   [DISCONTINUED] omeprazole (PRILOSEC) 20 MG capsule Take 20 mg by mouth daily.   [DISCONTINUED] omeprazole (PRILOSEC) 20 MG capsule Take 1 capsule (20 mg total) by mouth daily.   [DISCONTINUED] pravastatin (PRAVACHOL) 40 MG tablet Take 1 tablet (40 mg total) by mouth daily.   No facility-administered encounter medications on file as of 05/18/2021.    Surgical History: Past Surgical History:  Procedure Laterality Date   BREAST CYST ASPIRATION Left yrs ago   LUMBAR LAMINECTOMY/DECOMPRESSION MICRODISCECTOMY Left 06/18/2018   Procedure: LUMBAR LAMINECTOMY/DECOMPRESSION MICRODISCECTOMY 1 LEVEL, L5-S1  LEFT;  Surgeon: Meade Maw, MD;  Location: [facility] ORS;  Service: Neurosurgery;  Laterality: Left;   STENT PLACE LEFT URETER ([place] HX)     unsure  of side   TONSILLECTOMY     tubiligation      Medical History: Past Medical History:  Diagnosis Date   Anxiety    Back pain    Bulging lumbar disc    Chest pain    Depression    GERD (  gastroesophageal reflux disease)    History of kidney stones    Hypertension    Migraine    Sleep apnea    has never used a cpap machine   Varicose veins of legs    Vertigo    Vision problems     Family History: Family History  Problem Relation Age of Onset   Hypertension Mother    Cancer Father    Hypertension Father    Hyperlipidemia Father    Breast cancer Neg Hx     Social History   Socioeconomic History   Marital status: Married    Spouse name: Merry Proud   Number of children: Not on file   Years of education: Not on  file   Highest education level: Not on file  Occupational History   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    Comment: [employer]  Tobacco Use   Smoking status: Former    Types: Cigarettes    Quit date: 2000    Years since quitting: 23.2   Smokeless tobacco: Never  Vaping Use   Vaping Use: Never used  Substance and Sexual Activity   Alcohol use: Yes    Comment: socially   Drug use: No   Sexual activity: Not Currently  Other Topics Concern   Not on file  Social History Narrative   Not on file   Social Determinants of Health   Financial Resource Strain: Not on file  Food Insecurity: Not on file  Transportation Needs: Not on file  Physical Activity: Not on file  Stress: Not on file  Social Connections: Not on file  Intimate Partner Violence: Not on file      Review of Systems  Constitutional:  Negative for activity change, appetite change, chills, fatigue, fever and unexpected weight change.  HENT: Negative.  Negative for congestion, ear pain, rhinorrhea, sore throat and trouble swallowing.   Eyes: Negative.   Respiratory: Negative.  Negative for cough, chest tightness, shortness of breath and wheezing.   Cardiovascular: Negative.  Negative for chest pain and palpitations.  Gastrointestinal: Negative.  Negative for abdominal pain, blood in stool, constipation, diarrhea, nausea and vomiting.  Endocrine: Negative.   Genitourinary: Negative.  Negative for difficulty urinating, dysuria, frequency, hematuria and urgency.  Musculoskeletal: Negative.  Negative for arthralgias, back pain, joint swelling, myalgias and neck pain.  Skin: Negative.  Negative for rash and wound.  Allergic/Immunologic: Negative.  Negative for immunocompromised state.  Neurological: Negative.  Negative for dizziness, seizures, numbness and headaches.  Hematological: Negative.   Psychiatric/Behavioral:  Negative for behavioral problems, self-injury and suicidal ideas. The patient is not nervous/anxious.     Vital Signs: BP 126/72    Pulse 62    Temp 98.4 F (36.9 C)    Resp 16    Ht $R'5\' 3"'tX$  (1.6 m)    Wt 175 lb 3.2 oz (79.5 kg)    SpO2 98%    BMI 31.04 kg/m    Physical Exam Vitals reviewed.  Constitutional:      General: She is awake. She is not in acute distress.    Appearance: Normal appearance. She is well-developed and well-groomed. She is obese. She is not ill-appearing or diaphoretic.  HENT:     Head: Normocephalic and atraumatic.     Right Ear: Tympanic membrane, ear canal and external ear normal.     Left Ear: Tympanic membrane, ear canal and external ear normal.     Nose: Nose normal. No congestion or rhinorrhea.  Mouth/Throat:     Lips: Pink.     Mouth: Mucous membranes are moist.     Pharynx: Oropharynx is clear. Uvula midline. No oropharyngeal exudate or posterior oropharyngeal erythema.  Eyes:     General: Lids are normal. Vision grossly intact. Gaze aligned appropriately. No scleral icterus.       Right eye: No discharge.        Left eye: No discharge.     Extraocular Movements: Extraocular movements intact.     Conjunctiva/sclera: Conjunctivae normal.     Pupils: Pupils are equal, round, and reactive to light.     Funduscopic exam:    Right eye: Red reflex present.        Left eye: Red reflex present. Neck:     Thyroid: No thyromegaly.     Vascular: No JVD.     Trachea: Trachea and phonation normal. No tracheal deviation.  Cardiovascular:     Rate and Rhythm: Normal rate and regular rhythm.     Pulses: Normal pulses.     Heart sounds: Normal heart sounds, S1 normal and S2 normal. No murmur heard.   No friction rub. No gallop.  Pulmonary:     Effort: Pulmonary effort is normal. No accessory muscle usage or respiratory distress.     Breath sounds: Normal breath sounds. No stridor. No wheezing or rales.  Chest:     Chest wall: No tenderness.     Comments: Declined clinical breast exam, patient gets annual mammograms.  Abdominal:     General: Bowel sounds  are normal. There is no distension.     Palpations: Abdomen is soft. There is no shifting dullness, fluid wave, mass or pulsatile mass.     Tenderness: There is no abdominal tenderness. There is no guarding or rebound.  Musculoskeletal:        General: No tenderness or deformity. Normal range of motion.     Cervical back: Normal range of motion and neck supple.     Right lower leg: No edema.     Left lower leg: No edema.  Lymphadenopathy:     Cervical: No cervical adenopathy.  Skin:    General: Skin is warm and dry.     Capillary Refill: Capillary refill takes less than 2 seconds.     Coloration: Skin is not pale.     Findings: No erythema or rash.  Neurological:     Mental Status: She is alert and oriented to person, place, and time.     Cranial Nerves: No cranial nerve deficit.     Motor: No abnormal muscle tone.     Coordination: Coordination normal.     Deep Tendon Reflexes: Reflexes are normal and symmetric.  Psychiatric:        Mood and Affect: Mood normal.        Behavior: Behavior normal. Behavior is cooperative.        Thought Content: Thought content normal.        Judgment: Judgment normal.       Assessment/Plan: 1. Encounter for routine adult health examination with abnormal findings Age-appropriate preventive screenings and vaccinations discussed, annual physical exam completed. Routine labs for health maintenance ordered, see below. PHM updated.  - CBC with Differential/Platelet - CMP14+EGFR  2. Essential (primary) hypertension Amlodipine refill ordered, routine labs ordered - CBC with Differential/Platelet - CMP14+EGFR - amLODipine (NORVASC) 10 MG tablet; Take 1 tablet (10 mg total) by mouth daily.  Dispense: 90 tablet; Refill: 2  3. Mixed hyperlipidemia Ezetimibe  refill ordered, routine labs ordered - CBC with Differential/Platelet - CMP14+EGFR - Lipid Profile - ezetimibe (ZETIA) 10 MG tablet; Take 1 tablet (10 mg total) by mouth daily.  Dispense: 90  tablet; Refill: 2  4. Hypothyroidism, unspecified type Routine labs ordered - CBC with Differential/Platelet - CMP14+EGFR - TSH + free T4  5. Vitamin D deficiency Routine labs ordered.  - CBC with Differential/Platelet - CMP14+EGFR - Vitamin D (25 hydroxy)  6. Dysuria Routine urinalysis done - POCT Urinalysis Dipstick - CULTURE, URINE COMPREHENSIVE  7. GAD (generalized anxiety disorder) Start duloxetine, follow up in 1 month - DULoxetine (CYMBALTA) 30 MG capsule; Take 1 capsule (30 mg total) by mouth daily.  Dispense: 30 capsule; Refill: 2      General Counseling: Raja verbalizes understanding of the findings of todays visit and agrees with plan of treatment. I have discussed any further diagnostic evaluation that may be needed or ordered today. We also reviewed her medications today. she has been encouraged to call the office with any questions or concerns that should arise related to todays visit.    Orders Placed This Encounter  Procedures   CULTURE, URINE COMPREHENSIVE   CBC with Differential/Platelet   CMP14+EGFR   Lipid Profile   TSH + free T4   Vitamin D (25 hydroxy)   POCT Urinalysis Dipstick    Meds ordered this encounter  Medications   DISCONTD: omeprazole (PRILOSEC) 20 MG capsule    Sig: Take 1 capsule (20 mg total) by mouth daily.    Dispense:  90 capsule    Refill:  1    Please note decreased dose, discontinue $RemoveBeforeDEI'40mg'[id number]$  capsule.   ezetimibe (ZETIA) 10 MG tablet    Sig: Take 1 tablet (10 mg total) by mouth daily.    Dispense:  90 tablet    Refill:  2    Does not need refill right now, just picked one up   amLODipine (NORVASC) 10 MG tablet    Sig: Take 1 tablet (10 mg total) by mouth daily.    Dispense:  90 tablet    Refill:  2   DISCONTD: DULoxetine (CYMBALTA) 60 MG capsule    Sig: Take 1 capsule (60 mg total) by mouth daily.    Dispense:  30 capsule    Refill:  1   DULoxetine (CYMBALTA) 30 MG capsule    Sig: Take 1 capsule (30 mg total) by  mouth daily.    Dispense:  30 capsule    Refill:  2    Discontinue previous duloxetine 60 mg prescription, fill this one today.   DISCONTD: omeprazole (PRILOSEC) 20 MG capsule    Sig: Take 1 capsule (20 mg total) by mouth daily.    Dispense:  90 capsule    Refill:  1    Please note decreased dose, discontinue $RemoveBeforeDEI'40mg'APAMnftqpbEdndsc$  capsule.    Return in about 1 month (around 06/15/2021) for  , F/U, eval new med, Daking Westervelt PCP decreased duloxetine dose.   Total time spent:30 Minutes Time spent includes review of chart, medications, test results, and follow up plan with the patient.   [place] Controlled Substance Database was reviewed by me.  This patient was seen by Jonetta Osgood, FNP-C in collaboration with Dr. Clayborn Bigness as a part of collaborative care agreement.  Oaklee Sunga R. Valetta Fuller, MSN, FNP-C Internal medicine

## 2021-05-24 ENCOUNTER — Encounter: Payer: Self-pay | Admitting: Nurse Practitioner

## 2021-05-24 LAB — CULTURE, URINE COMPREHENSIVE

## 2021-05-29 NOTE — Telephone Encounter (Signed)
I called patient and let her know that her urinalysis was normal except for a low specific gravity which just means that your urine is too diluted which can happen if your drink a lot of water before giving a urine specimen. The urine culture was negative for UTI as the only that grew was a small amount of mixed urogenital flora which is a normal finding.

## 2021-06-01 DIAGNOSIS — R42 Dizziness and giddiness: Secondary | ICD-10-CM | POA: Diagnosis not present

## 2021-06-19 ENCOUNTER — Encounter: Payer: Self-pay | Admitting: Nurse Practitioner

## 2021-06-21 ENCOUNTER — Telehealth: Payer: Self-pay

## 2021-06-21 ENCOUNTER — Encounter: Payer: Self-pay | Admitting: Nurse Practitioner

## 2021-06-21 ENCOUNTER — Other Ambulatory Visit: Payer: Self-pay

## 2021-06-21 ENCOUNTER — Ambulatory Visit (INDEPENDENT_AMBULATORY_CARE_PROVIDER_SITE_OTHER): Payer: Medicare HMO | Admitting: Nurse Practitioner

## 2021-06-21 ENCOUNTER — Ambulatory Visit
Admission: RE | Admit: 2021-06-21 | Discharge: 2021-06-21 | Disposition: A | Discharge status: Home / Self Care | Payer: Medicare HMO | Source: Ambulatory Visit | Attending: Nurse Practitioner | Admitting: Nurse Practitioner

## 2021-06-21 VITALS — BP 134/88 | HR 79 | Temp 98.4°F | Resp 16 | Ht 63.0 in | Wt 175.8 lb

## 2021-06-21 DIAGNOSIS — S199XXA Unspecified injury of neck, initial encounter: Secondary | ICD-10-CM

## 2021-06-21 DIAGNOSIS — R112 Nausea with vomiting, unspecified: Secondary | ICD-10-CM

## 2021-06-21 DIAGNOSIS — I10 Essential (primary) hypertension: Secondary | ICD-10-CM | POA: Diagnosis not present

## 2021-06-21 DIAGNOSIS — R42 Dizziness and giddiness: Secondary | ICD-10-CM | POA: Insufficient documentation

## 2021-06-21 DIAGNOSIS — S0990XA Unspecified injury of head, initial encounter: Secondary | ICD-10-CM | POA: Insufficient documentation

## 2021-06-21 DIAGNOSIS — R519 Headache, unspecified: Secondary | ICD-10-CM | POA: Diagnosis not present

## 2021-06-21 NOTE — Telephone Encounter (Signed)
Patient scheduled for stat ct head on 06/21/21 @ [facility] ?tat ?

## 2021-06-21 NOTE — Telephone Encounter (Signed)
Stat head CT ordered. Printed & gave to [facility] ?

## 2021-06-21 NOTE — Telephone Encounter (Signed)
-----   Message from Jonetta Osgood, NP sent at 06/21/2021  4:45 PM EDT ----- ?Please call patient and let her know that her CT scan of the head was normal there were no acute intracranial abnormalities, no swelling or edema noted no acute changes that would explain her symptoms.  It is highly likely that she suffered a mild to moderate concussion which often does not show any changes on imaging but are usually accompanied by persistent symptoms such as vertigo, dizziness, nausea and persistent headache.  We will follow-up in a week and see how she is doing --follow-up as already scheduled ?

## 2021-06-21 NOTE — Telephone Encounter (Signed)
Spoke to pt, provided results  

## 2021-06-21 NOTE — Progress Notes (Signed)
Please call patient and let her know that her CT scan of the head was normal there were no acute intracranial abnormalities, no swelling or edema noted no acute changes that would explain her symptoms.  It is highly likely that she suffered a mild to moderate concussion which often does not show any changes on imaging but are usually accompanied by persistent symptoms such as vertigo, dizziness, nausea and persistent headache.  We will follow-up in a week and see how she is doing --follow-up as already scheduled

## 2021-06-21 NOTE — Progress Notes (Signed)
[facility] ?[address] ?[place], [place] [address] ? ?Internal MEDICINE  ?Office Visit Note ? ?Patient Name: Patricia Frazier ? [id number]  ?[id number] ? ?Date of Service: 06/21/2021 ? ?Chief Complaint  ?Patient presents with  ? Acute Visit  ?  Fall 3 weeks ago  ? Dizziness  ? Headache  ?  Pressure in eyes   ? ? ? ?HPI ?[name] presents for an acute sick visit for persistent dizziness, headache, and pressure behind her eyes after falling 3 weeks ago. She did not go to the ER or urgent care when she fell and has not had any imaging done. She reports that she did hit her head when she fell and had bruising around her right eye and down the right side of her face. The bruising has resolved since the fall but she continues to experience intermittent dizziness, vertigo, and has had a constant headache (7/10) and pressure behind her eyes.  ? ? ?Current Medication: ? ?Outpatient Encounter Medications as of 06/21/2021  ?Medication Sig  ? amLODipine (NORVASC) 10 MG tablet Take 1 tablet (10 mg total) by mouth daily.  ? ezetimibe (ZETIA) 10 MG tablet Take 1 tablet (10 mg total) by mouth daily.  ? ibuprofen (ADVIL) 200 MG tablet Take 400 mg by mouth every 6 (six) hours as needed.  ? Multiple Vitamin (MULTIVITAMIN) tablet Take 1 tablet by mouth daily. chewable  ? omeprazole (PRILOSEC) 20 MG capsule Take 1 capsule (20 mg total) by mouth daily.  ? pravastatin (PRAVACHOL) 40 MG tablet Take 1 tablet (40 mg total) by mouth daily.  ? DULoxetine (CYMBALTA) 30 MG capsule Take 1 capsule (30 mg total) by mouth daily.  ? ?No facility-administered encounter medications on file as of 06/21/2021.  ? ? ? ? ?Medical History: ?Past Medical History:  ?Diagnosis Date  ? Anxiety   ? Back pain   ? Bulging lumbar disc   ? Chest pain   ? Depression   ? GERD (gastroesophageal reflux disease)   ? History of kidney stones   ? Hypertension   ? Migraine   ? Sleep apnea   ? has never used a cpap machine  ? Varicose veins of legs   ? Vertigo   ?  Vision problems   ? ? ? ?Vital Signs: ?BP 134/88   Pulse 79   Temp 98.4 ?F (36.9 ?C)   Resp 16   Ht '5\' 3"'$  (1.6 m)   Wt 175 lb 12.8 oz (79.7 kg)   SpO2 97%   BMI 31.14 kg/m?  ? ? ?Review of Systems  ?Constitutional:  Negative for chills, fatigue and unexpected weight change.  ?HENT:  Negative for congestion, nosebleeds, rhinorrhea, sneezing and sore throat.   ?Eyes:  Positive for pain (pressure and tension). Negative for photophobia, discharge, redness and visual disturbance.  ?Respiratory: Negative.  Negative for cough, chest tightness, shortness of breath and wheezing.   ?Cardiovascular: Negative.  Negative for chest pain and palpitations.  ?Gastrointestinal:  Positive for nausea. Negative for abdominal pain, constipation, diarrhea and vomiting.  ?Genitourinary:  Negative for dysuria and frequency.  ?Musculoskeletal:  Negative for arthralgias, back pain, joint swelling and neck pain.  ?Skin:  Negative for rash.  ?Neurological:  Positive for dizziness, light-headedness and headaches. Negative for tremors, seizures, syncope, weakness and numbness.  ?Hematological:  Negative for adenopathy. Does not bruise/bleed easily.  ?Psychiatric/Behavioral:  Negative for behavioral problems (Depression), sleep disturbance and suicidal ideas. The patient is not nervous/anxious.   ? ?Physical Exam ?Vitals  reviewed.  ?Constitutional:   ?   Appearance: She is well-developed.  ?HENT:  ?   Head: Normocephalic and atraumatic.  ?Eyes:  ?   Extraocular Movements: Extraocular movements intact.  ?   Pupils: Pupils are equal, round, and reactive to light.  ?Cardiovascular:  ?   Rate and Rhythm: Normal rate and regular rhythm.  ?Pulmonary:  ?   Effort: Pulmonary effort is normal. No respiratory distress.  ?Skin: ?   Capillary Refill: Capillary refill takes less than 2 seconds.  ?Neurological:  ?   Mental Status: She is alert and oriented to person, place, and time.  ?   GCS: GCS eye subscore is 4. GCS verbal subscore is 5. GCS motor  subscore is 6.  ?   Sensory: No sensory deficit.  ?   Motor: No weakness.  ?   Gait: Gait normal.  ?Psychiatric:     ?   Mood and Affect: Mood normal.     ?   Behavior: Behavior normal.  ? ? ? ? ?Assessment/Plan: ?1. Vertigo ?Rule out any acute intercranial abnormalities or injuries, Ct head ordered.  ?- CT HEAD WO CONTRAST (5MM); Future ? ?2. Headache due to injury of head and neck ?See problem #1 ?- CT HEAD WO CONTRAST (5MM); Future ? ?3. Essential (primary) hypertension ?Blood pressure is stable. ? ?4. Nausea and vomiting, unspecified vomiting type ?See problem #1 ?- CT HEAD WO CONTRAST (5MM); Future ? ? ?General Counseling: shaelyn decarli understanding of the findings of todays visit and agrees with plan of treatment. I have discussed any further diagnostic evaluation that may be needed or ordered today. We also reviewed her medications today. she has been encouraged to call the office with any questions or concerns that should arise related to todays visit. ? ? ? ?Counseling: ? ? ? ?Orders Placed This Encounter  ?Procedures  ? CT HEAD WO CONTRAST (5MM)  ? ? ?No orders of the defined types were placed in this encounter. ? ? ?Return in about 1 week (around 06/28/2021) for F/U, [name] PCP. ? ?[place] Controlled Substance Database was reviewed by me for overdose risk score (ORS) ? ?Time spent:30 Minutes ?Time spent with patient included reviewing progress notes, labs, imaging studies, and discussing plan for follow up.  ? ?This patient was seen by Jonetta Osgood, FNP-C in collaboration with Dr. Clayborn Bigness as a part of collaborative care agreement. ? ?[name] R. Valetta Fuller, MSN, FNP-C ?Internal Medicine ?

## 2021-06-23 DIAGNOSIS — R69 Illness, unspecified: Secondary | ICD-10-CM | POA: Diagnosis not present

## 2021-06-23 DIAGNOSIS — Z0001 Encounter for general adult medical examination with abnormal findings: Secondary | ICD-10-CM | POA: Diagnosis not present

## 2021-06-23 DIAGNOSIS — E782 Mixed hyperlipidemia: Secondary | ICD-10-CM | POA: Diagnosis not present

## 2021-06-23 DIAGNOSIS — E559 Vitamin D deficiency, unspecified: Secondary | ICD-10-CM | POA: Diagnosis not present

## 2021-06-23 DIAGNOSIS — F411 Generalized anxiety disorder: Secondary | ICD-10-CM | POA: Diagnosis not present

## 2021-06-23 DIAGNOSIS — R300 Dysuria: Secondary | ICD-10-CM | POA: Diagnosis not present

## 2021-06-23 DIAGNOSIS — E039 Hypothyroidism, unspecified: Secondary | ICD-10-CM | POA: Diagnosis not present

## 2021-06-23 DIAGNOSIS — I10 Essential (primary) hypertension: Secondary | ICD-10-CM | POA: Diagnosis not present

## 2021-06-24 LAB — CMP14+EGFR
ALT: 18 IU/L (ref 0–32)
AST: 24 IU/L (ref 0–40)
Albumin/Globulin Ratio: 2.3 — ABNORMAL HIGH (ref 1.2–2.2)
Albumin: 4.8 g/dL — ABNORMAL HIGH (ref 3.7–4.7)
Alkaline Phosphatase: 83 IU/L (ref 44–121)
BUN/Creatinine Ratio: 25 (ref 12–28)
BUN: 18 mg/dL (ref 8–27)
Bilirubin Total: 0.3 mg/dL (ref 0.0–1.2)
CO2: 25 mmol/L (ref 20–29)
Calcium: 10 mg/dL (ref 8.7–10.3)
Chloride: 100 mmol/L (ref 96–106)
Creatinine, Ser: 0.71 mg/dL (ref 0.57–1.00)
Globulin, Total: 2.1 g/dL (ref 1.5–4.5)
Glucose: 90 mg/dL (ref 70–99)
Potassium: 4.5 mmol/L (ref 3.5–5.2)
Sodium: 139 mmol/L (ref 134–144)
Total Protein: 6.9 g/dL (ref 6.0–8.5)
eGFR: 90 mL/min/{1.73_m2} (ref >59)

## 2021-06-24 LAB — LIPID PANEL
Chol/HDL Ratio: 2.3 ratio (ref 0.0–4.4)
Cholesterol, Total: 204 mg/dL — ABNORMAL HIGH (ref 100–199)
HDL: 88 mg/dL (ref >39)
LDL Chol Calc (NIH): 104 mg/dL — ABNORMAL HIGH (ref 0–99)
Triglycerides: 66 mg/dL (ref 0–149)
VLDL Cholesterol Cal: 12 mg/dL (ref 5–40)

## 2021-06-24 LAB — CBC WITH DIFFERENTIAL/PLATELET
Basophils Absolute: 0 10*3/uL (ref 0.0–0.2)
Basos: 0 %
EOS (ABSOLUTE): 0.1 10*3/uL (ref 0.0–0.4)
Eos: 2 %
Hematocrit: 42 % (ref 34.0–46.6)
Hemoglobin: 14.3 g/dL (ref 11.1–15.9)
Immature Grans (Abs): 0 10*3/uL (ref 0.0–0.1)
Immature Granulocytes: 0 %
Lymphocytes Absolute: 0.9 10*3/uL (ref 0.7–3.1)
Lymphs: 16 %
MCH: 30 pg (ref 26.6–33.0)
MCHC: 34 g/dL (ref 31.5–35.7)
MCV: 88 fL (ref 79–97)
Monocytes Absolute: 0.4 10*3/uL (ref 0.1–0.9)
Monocytes: 7 %
Neutrophils Absolute: 4.3 10*3/uL (ref 1.4–7.0)
Neutrophils: 75 %
Platelets: 241 10*3/uL (ref 150–450)
RBC: 4.77 x10E6/uL (ref 3.77–5.28)
RDW: 13.7 % (ref 11.7–15.4)
WBC: 5.9 10*3/uL (ref 3.4–10.8)

## 2021-06-24 LAB — VITAMIN D 25 HYDROXY (VIT D DEFICIENCY, FRACTURES): Vit D, 25-Hydroxy: 92.2 ng/mL (ref 30.0–100.0)

## 2021-06-24 LAB — TSH+FREE T4
Free T4: 1.17 ng/dL (ref 0.82–1.77)
TSH: 0.91 u[IU]/mL (ref 0.450–4.500)

## 2021-06-27 ENCOUNTER — Telehealth: Payer: Self-pay

## 2021-06-27 NOTE — Telephone Encounter (Signed)
Spoke to pt, provided results, pt confirmed she needed to come in for her next appt, per Alyssa yes since it was a follow up regarding her fall. ?

## 2021-06-27 NOTE — Telephone Encounter (Signed)
-----   Message from Jonetta Osgood, NP sent at 06/26/2021  6:32 AM EDT ----- ?Please call patient and let her know her lab results: ?--metabolic panel is normal except for slightly elevated albumin. Will recheck metabolic panel in 1-2 months. ?--cholesterol levels are slightly abnormal. Total cholesterol is slightly elevated at 204 and LCL at 104. Recommend starting OTC fish oil supplement 1000 mg daily if not already taking.  ?--CBC is normal ?--thyroid levels and vitamin D level are normal ?

## 2021-06-29 ENCOUNTER — Encounter: Payer: Self-pay | Admitting: Nurse Practitioner

## 2021-06-29 ENCOUNTER — Other Ambulatory Visit: Payer: Self-pay

## 2021-06-29 ENCOUNTER — Ambulatory Visit (INDEPENDENT_AMBULATORY_CARE_PROVIDER_SITE_OTHER): Payer: Medicare HMO | Admitting: Nurse Practitioner

## 2021-06-29 VITALS — BP 118/82 | HR 90 | Temp 98.7°F | Resp 16 | Ht 63.0 in | Wt 177.0 lb

## 2021-06-29 DIAGNOSIS — I10 Essential (primary) hypertension: Secondary | ICD-10-CM | POA: Diagnosis not present

## 2021-06-29 DIAGNOSIS — S199XXA Unspecified injury of neck, initial encounter: Secondary | ICD-10-CM

## 2021-06-29 DIAGNOSIS — R42 Dizziness and giddiness: Secondary | ICD-10-CM

## 2021-06-29 DIAGNOSIS — E559 Vitamin D deficiency, unspecified: Secondary | ICD-10-CM

## 2021-06-29 DIAGNOSIS — E782 Mixed hyperlipidemia: Secondary | ICD-10-CM | POA: Diagnosis not present

## 2021-06-29 NOTE — Progress Notes (Signed)
[facility] ?[address] ?[place], [place] [address] ? ?Internal MEDICINE  ?Office Visit Note ? ?Patient Name: Patricia Frazier ? [id number]  ?[id number] ? ?Date of Service: 06/29/2021 ? ?Chief Complaint  ?Patient presents with  ? Depression  ? Gastroesophageal Reflux  ? Hypertension  ? Anxiety  ? Follow-up  ?  Vertigo   ? Results  ?  Blood work  ? ? ?HPI ?Juvia presents for follow-up visit for residual symptoms from a mild concussion and to review routine labs that were done.  Patient was previously notified that her CT head was normal and no acute intracranial abnormalities were found.  Today she reports that her residual symptoms from the concussion have improved and are resolving.  She reports that the dizziness and vertigo is much improved and almost gone and the headache has dissipated.  She denies any nausea. ?Recent labs reviewed with patient today: ?-- Her vitamin D level is normal at 92.2. ?--Her CBC is normal, her metabolic panel is grossly normal, albumin is slightly elevated by 0.1 and her albumin/globulin ratio is also elevated by 0.1 but there is no prior abnormals related to these 2 values. ?--Her cholesterol is slightly abnormal with her LDL at 104 and her total cholesterol at 204.  Her triglycerides are normal at 66, HDL is 88 and VLDL is 12.  Her cholesterol/HDL ratio is 2.3 which is less than half the average risk of heart disease. ?--Her thyroid levels are normal ?The patient has no other questions or concerns today. ? ? ? ? ?Current Medication: ?Outpatient Encounter Medications as of 06/29/2021  ?Medication Sig  ? amLODipine (NORVASC) 10 MG tablet Take 1 tablet (10 mg total) by mouth daily.  ? DULoxetine (CYMBALTA) 30 MG capsule Take 1 capsule (30 mg total) by mouth daily.  ? ezetimibe (ZETIA) 10 MG tablet Take 1 tablet (10 mg total) by mouth daily.  ? ibuprofen (ADVIL) 200 MG tablet Take 400 mg by mouth every 6 (six) hours as needed.  ? Multiple Vitamin (MULTIVITAMIN) tablet Take  1 tablet by mouth daily. chewable  ? omeprazole (PRILOSEC) 20 MG capsule Take 1 capsule (20 mg total) by mouth daily.  ? pravastatin (PRAVACHOL) 40 MG tablet Take 1 tablet (40 mg total) by mouth daily.  ? ?No facility-administered encounter medications on file as of 06/29/2021.  ? ? ?Surgical History: ?Past Surgical History:  ?Procedure Laterality Date  ? BREAST CYST ASPIRATION Left yrs ago  ? LUMBAR LAMINECTOMY/DECOMPRESSION MICRODISCECTOMY Left 06/18/2018  ? Procedure: LUMBAR LAMINECTOMY/DECOMPRESSION MICRODISCECTOMY 1 LEVEL, L5-S1  LEFT;  Surgeon: Meade Maw, MD;  Location: [facility] ORS;  Service: Neurosurgery;  Laterality: Left;  ? STENT PLACE LEFT URETER ([place] HX)    ? unsure  of side  ? TONSILLECTOMY    ? tubiligation    ? ? ?Medical History: ?Past Medical History:  ?Diagnosis Date  ? Anxiety   ? Back pain   ? Bulging lumbar disc   ? Chest pain   ? Depression   ? GERD (gastroesophageal reflux disease)   ? History of kidney stones   ? Hypertension   ? Migraine   ? Sleep apnea   ? has never used a cpap machine  ? Varicose veins of legs   ? Vertigo   ? Vision problems   ? ? ?Family History: ?Family History  ?Problem Relation Age of Onset  ? Hypertension Mother   ? Cancer Father   ? Hypertension Father   ? Hyperlipidemia Father   ?  Breast cancer Neg Hx   ? ? ?Social History  ? ?Socioeconomic History  ? Marital status: Married  ?  Spouse name: Merry Proud  ? Number of children: Not on file  ? Years of education: Not on file  ? Highest education level: Not on file  ?Occupational History  ? 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  Comment: [employer]  ?Tobacco Use  ? Smoking status: Former  ?  Types: Cigarettes  ?  Quit date: 2000  ?  Years since quitting: 23.2  ? Smokeless tobacco: Never  ?Vaping Use  ? Vaping Use: Never used  ?Substance and Sexual Activity  ? Alcohol use: Yes  ?  Comment: socially  ? Drug use: No  ? Sexual activity: Not Currently  ?Other Topics Concern  ? Not on file  ?Social History Narrative  ? Not on  file  ? ?Social Determinants of Health  ? ?Financial Resource Strain: Not on file  ?Food Insecurity: Not on file  ?Transportation Needs: Not on file  ?Physical Activity: Not on file  ?Stress: Not on file  ?Social Connections: Not on file  ?Intimate Partner Violence: Not on file  ? ? ? ? ?Review of Systems  ?Constitutional:  Negative for chills, fatigue and unexpected weight change.  ?HENT:  Negative for congestion, rhinorrhea, sneezing and sore throat.   ?Eyes:  Negative for redness.  ?Respiratory: Negative.  Negative for cough, chest tightness, shortness of breath and wheezing.   ?Cardiovascular: Negative.  Negative for chest pain and palpitations.  ?Gastrointestinal: Negative.  Negative for abdominal pain, constipation, diarrhea, nausea (resolved) and vomiting.  ?Genitourinary:  Negative for dysuria and frequency.  ?Musculoskeletal: Negative.  Negative for arthralgias, back pain, joint swelling and neck pain.  ?Skin:  Negative for rash.  ?Neurological:  Positive for dizziness (much improved) and headaches (improving and soon to be resolved.). Negative for tremors and numbness.  ?Hematological:  Negative for adenopathy. Does not bruise/bleed easily.  ?Psychiatric/Behavioral: Negative.  Negative for behavioral problems (Depression), sleep disturbance and suicidal ideas. The patient is not nervous/anxious.   ? ?Vital Signs: ?BP 118/82   Pulse 90   Temp 98.7 ?F (37.1 ?C)   Resp 16   Ht '5\' 3"'$  (1.6 m)   Wt 177 lb (80.3 kg)   SpO2 97%   BMI 31.35 kg/m?  ? ? ?Physical Exam ?Vitals reviewed.  ?Constitutional:   ?   General: She is not in acute distress. ?   Appearance: Normal appearance. She is obese. She is not ill-appearing.  ?HENT:  ?   Head: Normocephalic and atraumatic.  ?Eyes:  ?   Pupils: Pupils are equal, round, and reactive to light.  ?Cardiovascular:  ?   Rate and Rhythm: Normal rate and regular rhythm.  ?Pulmonary:  ?   Effort: Pulmonary effort is normal. No respiratory distress.  ?Neurological:  ?    Mental Status: She is alert and oriented to person, place, and time.  ?   Cranial Nerves: No cranial nerve deficit.  ?   Coordination: Coordination normal.  ?   Gait: Gait normal.  ?Psychiatric:     ?   Mood and Affect: Mood normal.     ?   Behavior: Behavior normal.  ? ? ? ? ? ?Assessment/Plan: ?1. Vertigo ?Vertigo is much improved and continuing to resolve. ? ?2. Headache due to injury of head and neck ?Headache is much improved and continuing to resolve. ? ?3. Essential (primary) hypertension ?Blood pressure is well controlled with amlodipine  10 mg daily, no changes. ? ?4. Mixed hyperlipidemia ?Cholesterol levels slightly elevated, continue to limit red meat intake, continue to increase lean protein intake, may take over-the-counter fish oil supplement 1000 mg daily if desired.  Continue pravastatin 40 mg daily and ezetimibe 10 mg daily. ? ?5. Vitamin D deficiency ?Resolved, no supplement needed at this time ? ? ?General Counseling: aleka twitty understanding of the findings of todays visit and agrees with plan of treatment. I have discussed any further diagnostic evaluation that may be needed or ordered today. We also reviewed her medications today. she has been encouraged to call the office with any questions or concerns that should arise related to todays visit. ? ? ? ?No orders of the defined types were placed in this encounter. ? ? ?No orders of the defined types were placed in this encounter. ? ? ?Return for please reschedule may appt for mid august, can do every 6 month appts. . ? ? ?Total time spent:30 Minutes ?Time spent includes review of chart, medications, test results, and follow up plan with the patient.  ? ?[place] Controlled Substance Database was reviewed by me. ? ?This patient was seen by Jonetta Osgood, FNP-C in collaboration with Dr. Clayborn Bigness as a part of collaborative care agreement. ? ? ?Yordin Rhoda R. Valetta Fuller, MSN, FNP-C ?Internal medicine  ?

## 2021-06-30 ENCOUNTER — Encounter: Payer: Self-pay | Admitting: Nurse Practitioner

## 2021-07-06 ENCOUNTER — Encounter: Payer: Self-pay | Admitting: Nurse Practitioner

## 2021-07-18 ENCOUNTER — Other Ambulatory Visit: Payer: Self-pay | Admitting: Nurse Practitioner

## 2021-07-18 DIAGNOSIS — Z1231 Encounter for screening mammogram for malignant neoplasm of breast: Secondary | ICD-10-CM

## 2021-08-02 DIAGNOSIS — H5213 Myopia, bilateral: Secondary | ICD-10-CM | POA: Diagnosis not present

## 2021-08-02 DIAGNOSIS — Z0100 Encounter for examination of eyes and vision without abnormal findings: Secondary | ICD-10-CM | POA: Diagnosis not present

## 2021-08-02 DIAGNOSIS — H524 Presbyopia: Secondary | ICD-10-CM | POA: Diagnosis not present

## 2021-08-02 DIAGNOSIS — H401231 Low-tension glaucoma, bilateral, mild stage: Secondary | ICD-10-CM | POA: Diagnosis not present

## 2021-08-02 DIAGNOSIS — H2513 Age-related nuclear cataract, bilateral: Secondary | ICD-10-CM | POA: Diagnosis not present

## 2021-08-14 ENCOUNTER — Ambulatory Visit: Payer: Medicare HMO | Admitting: Nurse Practitioner

## 2021-08-28 ENCOUNTER — Other Ambulatory Visit: Payer: Self-pay

## 2021-08-28 DIAGNOSIS — F411 Generalized anxiety disorder: Secondary | ICD-10-CM

## 2021-08-28 MED ORDER — DULOXETINE HCL 30 MG PO CPEP: 30.0000 mg | ORAL_CAPSULE | Freq: Every day | ORAL | 1 refills | Status: DC

## 2021-10-04 ENCOUNTER — Ambulatory Visit
Admission: RE | Admit: 2021-10-04 | Discharge: 2021-10-04 | Disposition: A | Discharge status: Home / Self Care | Payer: Medicare HMO | Source: Ambulatory Visit | Attending: Nurse Practitioner | Admitting: Nurse Practitioner

## 2021-10-04 DIAGNOSIS — Z1231 Encounter for screening mammogram for malignant neoplasm of breast: Secondary | ICD-10-CM | POA: Diagnosis not present

## 2021-10-30 ENCOUNTER — Other Ambulatory Visit: Payer: Self-pay | Admitting: Nurse Practitioner

## 2021-10-30 DIAGNOSIS — E782 Mixed hyperlipidemia: Secondary | ICD-10-CM

## 2021-11-01 ENCOUNTER — Other Ambulatory Visit: Payer: Self-pay | Admitting: Nurse Practitioner

## 2021-11-01 DIAGNOSIS — R300 Dysuria: Secondary | ICD-10-CM | POA: Diagnosis not present

## 2021-11-01 DIAGNOSIS — H5213 Myopia, bilateral: Secondary | ICD-10-CM | POA: Diagnosis not present

## 2021-11-01 DIAGNOSIS — Z0001 Encounter for general adult medical examination with abnormal findings: Secondary | ICD-10-CM | POA: Diagnosis not present

## 2021-11-01 DIAGNOSIS — E782 Mixed hyperlipidemia: Secondary | ICD-10-CM | POA: Diagnosis not present

## 2021-11-01 DIAGNOSIS — F411 Generalized anxiety disorder: Secondary | ICD-10-CM | POA: Diagnosis not present

## 2021-11-01 DIAGNOSIS — H524 Presbyopia: Secondary | ICD-10-CM | POA: Diagnosis not present

## 2021-11-01 DIAGNOSIS — E559 Vitamin D deficiency, unspecified: Secondary | ICD-10-CM | POA: Diagnosis not present

## 2021-11-01 DIAGNOSIS — H2513 Age-related nuclear cataract, bilateral: Secondary | ICD-10-CM | POA: Diagnosis not present

## 2021-11-01 DIAGNOSIS — E039 Hypothyroidism, unspecified: Secondary | ICD-10-CM | POA: Diagnosis not present

## 2021-11-01 DIAGNOSIS — I10 Essential (primary) hypertension: Secondary | ICD-10-CM | POA: Diagnosis not present

## 2021-11-01 DIAGNOSIS — R69 Illness, unspecified: Secondary | ICD-10-CM | POA: Diagnosis not present

## 2021-11-01 DIAGNOSIS — H401231 Low-tension glaucoma, bilateral, mild stage: Secondary | ICD-10-CM | POA: Diagnosis not present

## 2021-11-02 LAB — CBC WITH DIFFERENTIAL/PLATELET
Basophils Absolute: 0 10*3/uL (ref 0.0–0.2)
Basos: 0 %
EOS (ABSOLUTE): 0.1 10*3/uL (ref 0.0–0.4)
Eos: 2 %
Hematocrit: 43.1 % (ref 34.0–46.6)
Hemoglobin: 14 g/dL (ref 11.1–15.9)
Immature Grans (Abs): 0 10*3/uL (ref 0.0–0.1)
Immature Granulocytes: 0 %
Lymphocytes Absolute: 0.8 10*3/uL (ref 0.7–3.1)
Lymphs: 14 %
MCH: 29 pg (ref 26.6–33.0)
MCHC: 32.5 g/dL (ref 31.5–35.7)
MCV: 89 fL (ref 79–97)
Monocytes Absolute: 0.5 10*3/uL (ref 0.1–0.9)
Monocytes: 9 %
Neutrophils Absolute: 4.3 10*3/uL (ref 1.4–7.0)
Neutrophils: 75 %
Platelets: 247 10*3/uL (ref 150–450)
RBC: 4.82 x10E6/uL (ref 3.77–5.28)
RDW: 13.7 % (ref 11.7–15.4)
WBC: 5.7 10*3/uL (ref 3.4–10.8)

## 2021-11-02 LAB — CMP14+EGFR
ALT: 19 IU/L (ref 0–32)
AST: 24 IU/L (ref 0–40)
Albumin/Globulin Ratio: 2.1 (ref 1.2–2.2)
Albumin: 4.6 g/dL (ref 3.8–4.8)
Alkaline Phosphatase: 82 IU/L (ref 44–121)
BUN/Creatinine Ratio: 19 (ref 12–28)
BUN: 14 mg/dL (ref 8–27)
Bilirubin Total: 0.4 mg/dL (ref 0.0–1.2)
CO2: 26 mmol/L (ref 20–29)
Calcium: 10.1 mg/dL (ref 8.7–10.3)
Chloride: 100 mmol/L (ref 96–106)
Creatinine, Ser: 0.74 mg/dL (ref 0.57–1.00)
Globulin, Total: 2.2 g/dL (ref 1.5–4.5)
Glucose: 100 mg/dL — ABNORMAL HIGH (ref 70–99)
Potassium: 4.9 mmol/L (ref 3.5–5.2)
Sodium: 140 mmol/L (ref 134–144)
Total Protein: 6.8 g/dL (ref 6.0–8.5)
eGFR: 85 mL/min/{1.73_m2} (ref >59)

## 2021-11-02 LAB — LIPID PANEL
Chol/HDL Ratio: 2.4 ratio (ref 0.0–4.4)
Cholesterol, Total: 174 mg/dL (ref 100–199)
HDL: 74 mg/dL (ref >39)
LDL Chol Calc (NIH): 87 mg/dL (ref 0–99)
Triglycerides: 66 mg/dL (ref 0–149)
VLDL Cholesterol Cal: 13 mg/dL (ref 5–40)

## 2021-11-02 LAB — TSH+FREE T4
Free T4: 1.17 ng/dL (ref 0.82–1.77)
TSH: 1.06 u[IU]/mL (ref 0.450–4.500)

## 2021-11-02 LAB — VITAMIN D 25 HYDROXY (VIT D DEFICIENCY, FRACTURES): Vit D, 25-Hydroxy: 119.6 ng/mL — ABNORMAL HIGH (ref 30.0–100.0)

## 2021-11-06 ENCOUNTER — Other Ambulatory Visit: Payer: Self-pay | Admitting: Nurse Practitioner

## 2021-11-21 ENCOUNTER — Ambulatory Visit (INDEPENDENT_AMBULATORY_CARE_PROVIDER_SITE_OTHER): Payer: Medicare HMO | Admitting: Nurse Practitioner

## 2021-11-21 ENCOUNTER — Encounter: Payer: Self-pay | Admitting: Nurse Practitioner

## 2021-11-21 VITALS — BP 116/71 | HR 66 | Temp 98.4°F | Resp 16 | Ht 63.0 in | Wt 170.8 lb

## 2021-11-21 DIAGNOSIS — I10 Essential (primary) hypertension: Secondary | ICD-10-CM

## 2021-11-21 DIAGNOSIS — F411 Generalized anxiety disorder: Secondary | ICD-10-CM

## 2021-11-21 DIAGNOSIS — E782 Mixed hyperlipidemia: Secondary | ICD-10-CM

## 2021-11-21 DIAGNOSIS — R69 Illness, unspecified: Secondary | ICD-10-CM | POA: Diagnosis not present

## 2021-11-21 MED ORDER — AMLODIPINE BESYLATE 10 MG PO TABS: 10.0000 mg | ORAL_TABLET | Freq: Every day | ORAL | 2 refills | Status: DC

## 2021-11-21 MED ORDER — DULOXETINE HCL 30 MG PO CPEP: 30.0000 mg | ORAL_CAPSULE | Freq: Every day | ORAL | 3 refills | Status: DC

## 2021-11-21 NOTE — Progress Notes (Signed)
[facility] [place], [place] [address]  Internal MEDICINE  Office Visit Note  Patient Name: Patricia Frazier  [id number]  [id number]  Date of Service: 11/21/2021  Chief Complaint  Patient presents with   Follow-up   Depression   Gastroesophageal Reflux   Hypertension   Quality Metric Gaps    Shingles, Pneumonia and Tetanus    HPI Patricia Frazier presents for follow-up visit for hypertension, recommended vaccinations, lab review and medication refills --Blood pressure well controlled --Labs have overall improved, cholesterol levels are normal. --Recommended vaccinations discussed including shingles, pneumonia and tetanus --Has lost 7 pounds since March -- Refills due for amlodipine and duloxetine     Current Medication: Outpatient Encounter Medications as of 11/21/2021  Medication Sig   ezetimibe (ZETIA) 10 MG tablet Take 1 tablet (10 mg total) by mouth daily.   ibuprofen (ADVIL) 200 MG tablet Take 400 mg by mouth every 6 (six) hours as needed.   Multiple Vitamin (MULTIVITAMIN) tablet Take 1 tablet by mouth daily. chewable   omeprazole (PRILOSEC) 20 MG capsule Take 1 capsule by mouth once daily   pravastatin (PRAVACHOL) 40 MG tablet TAKE 1 TABLET BY MOUTH EVERY DAY   [DISCONTINUED] amLODipine (NORVASC) 10 MG tablet Take 1 tablet (10 mg total) by mouth daily.   [DISCONTINUED] DULoxetine (CYMBALTA) 30 MG capsule Take 1 capsule (30 mg total) by mouth daily.   amLODipine (NORVASC) 10 MG tablet Take 1 tablet (10 mg total) by mouth daily.   DULoxetine (CYMBALTA) 30 MG capsule Take 1 capsule (30 mg total) by mouth daily.   latanoprost (XALATAN) 0.005 % ophthalmic solution Place 1 drop into both eyes at bedtime.   No facility-administered encounter medications on file as of 11/21/2021.    Surgical History: Past Surgical History:  Procedure Laterality Date   BREAST CYST ASPIRATION Left yrs ago   LUMBAR LAMINECTOMY/DECOMPRESSION MICRODISCECTOMY Left  06/18/2018   Procedure: LUMBAR LAMINECTOMY/DECOMPRESSION MICRODISCECTOMY 1 LEVEL, L5-S1  LEFT;  Surgeon: Meade Maw, MD;  Location: [facility] ORS;  Service: Neurosurgery;  Laterality: Left;   STENT PLACE LEFT URETER ([place] HX)     unsure  of side   TONSILLECTOMY     tubiligation      Medical History: Past Medical History:  Diagnosis Date   Anxiety    Back pain    Bulging lumbar disc    Chest pain    Depression    GERD (gastroesophageal reflux disease)    History of kidney stones    Hypertension    Migraine    Sleep apnea    has never used a cpap machine   Varicose veins of legs    Vertigo    Vision problems     Family History: Family History  Problem Relation Age of Onset   Hypertension Mother    Cancer Father    Hypertension Father    Hyperlipidemia Father    Breast cancer Neg Hx     Social History   Socioeconomic History   Marital status: Married    Spouse name: Patricia Frazier   Number of children: Not on file   Years of education: Not on file   Highest education level: Not on file  Occupational History   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    Comment: [employer]  Tobacco Use   Smoking status: Former    Types: Cigarettes    Quit date: 2000    Years since quitting: 23.6   Smokeless tobacco: Never  Vaping Use  Vaping Use: Never used  Substance and Sexual Activity   Alcohol use: Yes    Comment: socially   Drug use: No   Sexual activity: Not Currently  Other Topics Concern   Not on file  Social History Narrative   Not on file   Social Determinants of Health   Financial Resource Strain: Not on file  Food Insecurity: Not on file  Transportation Needs: Not on file  Physical Activity: Not on file  Stress: Not on file  Social Connections: Not on file  Intimate Partner Violence: Not on file      Review of Systems  Constitutional:  Negative for chills, fatigue and unexpected weight change.  HENT:  Negative for congestion, postnasal drip, rhinorrhea,  sneezing and sore throat.   Eyes:  Negative for redness.  Respiratory: Negative.  Negative for cough, chest tightness, shortness of breath and wheezing.   Cardiovascular: Negative.  Negative for chest pain and palpitations.  Gastrointestinal: Negative.  Negative for abdominal pain, constipation, diarrhea, nausea and vomiting.  Genitourinary:  Negative for dysuria and frequency.  Musculoskeletal: Negative.  Negative for arthralgias, back pain, joint swelling and neck pain.  Skin:  Negative for rash.  Neurological: Negative.  Negative for tremors and numbness.  Hematological:  Negative for adenopathy. Does not bruise/bleed easily.  Psychiatric/Behavioral:  Negative for behavioral problems (Depression), sleep disturbance and suicidal ideas. The patient is not nervous/anxious.     Vital Signs: BP 116/71   Pulse 66   Temp 98.4 F (36.9 C)   Resp 16   Ht '5\' 3"'$  (1.6 m)   Wt 170 lb 12.8 oz (77.5 kg)   SpO2 93%   BMI 30.26 kg/m    Physical Exam Vitals reviewed.  Constitutional:      General: She is not in acute distress.    Appearance: Normal appearance. She is obese. She is not ill-appearing.  HENT:     Head: Normocephalic and atraumatic.  Eyes:     Pupils: Pupils are equal, round, and reactive to light.  Cardiovascular:     Rate and Rhythm: Normal rate and regular rhythm.  Pulmonary:     Effort: Pulmonary effort is normal. No respiratory distress.  Neurological:     Mental Status: She is alert and oriented to person, place, and time.  Psychiatric:        Mood and Affect: Mood normal.        Behavior: Behavior normal.        Assessment/Plan: 1. Essential (primary) hypertension Blood pressure stable, continue amlodipine as prescribed - amLODipine (NORVASC) 10 MG tablet; Take 1 tablet (10 mg total) by mouth daily.  Dispense: 90 tablet; Refill: 2  2. Mixed hyperlipidemia Taking pravastatin, no change  3. GAD (generalized anxiety disorder) Continue duloxetine as  prescribed, refills ordered. - DULoxetine (CYMBALTA) 30 MG capsule; Take 1 capsule (30 mg total) by mouth daily.  Dispense: 90 capsule; Refill: 3   General Counseling: Yarelis verbalizes understanding of the findings of todays visit and agrees with plan of treatment. I have discussed any further diagnostic evaluation that may be needed or ordered today. We also reviewed her medications today. she has been encouraged to call the office with any questions or concerns that should arise related to todays visit.    No orders of the defined types were placed in this encounter.   Meds ordered this encounter  Medications   DULoxetine (CYMBALTA) 30 MG capsule    Sig: Take 1 capsule (30 mg total) by mouth  daily.    Dispense:  90 capsule    Refill:  3    Please fill as a 90 day supply, thank you!   amLODipine (NORVASC) 10 MG tablet    Sig: Take 1 tablet (10 mg total) by mouth daily.    Dispense:  90 tablet    Refill:  2    Return for previously scheduled, CPE, Rance Smithson PCP in february. .   Total time spent:30 Minutes Time spent includes review of chart, medications, test results, and follow up plan with the patient.   [place] Controlled Substance Database was reviewed by me.  This patient was seen by Jonetta Osgood, FNP-C in collaboration with Dr. Clayborn Bigness as a part of collaborative care agreement.   Nehemyah Foushee R. Valetta Fuller, MSN, FNP-C Internal medicine

## 2022-01-08 DIAGNOSIS — D2262 Melanocytic nevi of left upper limb, including shoulder: Secondary | ICD-10-CM | POA: Diagnosis not present

## 2022-01-08 DIAGNOSIS — L821 Other seborrheic keratosis: Secondary | ICD-10-CM | POA: Diagnosis not present

## 2022-01-08 DIAGNOSIS — D485 Neoplasm of uncertain behavior of skin: Secondary | ICD-10-CM | POA: Diagnosis not present

## 2022-01-08 DIAGNOSIS — D225 Melanocytic nevi of trunk: Secondary | ICD-10-CM | POA: Diagnosis not present

## 2022-01-08 DIAGNOSIS — D044 Carcinoma in situ of skin of scalp and neck: Secondary | ICD-10-CM | POA: Diagnosis not present

## 2022-01-08 DIAGNOSIS — D2271 Melanocytic nevi of right lower limb, including hip: Secondary | ICD-10-CM | POA: Diagnosis not present

## 2022-01-08 DIAGNOSIS — D0461 Carcinoma in situ of skin of right upper limb, including shoulder: Secondary | ICD-10-CM | POA: Diagnosis not present

## 2022-01-08 DIAGNOSIS — D2272 Melanocytic nevi of left lower limb, including hip: Secondary | ICD-10-CM | POA: Diagnosis not present

## 2022-01-08 DIAGNOSIS — L814 Other melanin hyperpigmentation: Secondary | ICD-10-CM | POA: Diagnosis not present

## 2022-01-08 DIAGNOSIS — C44622 Squamous cell carcinoma of skin of right upper limb, including shoulder: Secondary | ICD-10-CM | POA: Diagnosis not present

## 2022-01-08 DIAGNOSIS — D2261 Melanocytic nevi of right upper limb, including shoulder: Secondary | ICD-10-CM | POA: Diagnosis not present

## 2022-01-20 ENCOUNTER — Encounter: Payer: Self-pay | Admitting: Nurse Practitioner

## 2022-01-22 DIAGNOSIS — C44622 Squamous cell carcinoma of skin of right upper limb, including shoulder: Secondary | ICD-10-CM | POA: Diagnosis not present

## 2022-02-05 DIAGNOSIS — D0461 Carcinoma in situ of skin of right upper limb, including shoulder: Secondary | ICD-10-CM | POA: Diagnosis not present

## 2022-02-05 DIAGNOSIS — D045 Carcinoma in situ of skin of trunk: Secondary | ICD-10-CM | POA: Diagnosis not present

## 2022-03-15 DIAGNOSIS — H2513 Age-related nuclear cataract, bilateral: Secondary | ICD-10-CM | POA: Diagnosis not present

## 2022-03-15 DIAGNOSIS — H401231 Low-tension glaucoma, bilateral, mild stage: Secondary | ICD-10-CM | POA: Diagnosis not present

## 2022-03-15 DIAGNOSIS — H5213 Myopia, bilateral: Secondary | ICD-10-CM | POA: Diagnosis not present

## 2022-03-15 DIAGNOSIS — H43813 Vitreous degeneration, bilateral: Secondary | ICD-10-CM | POA: Diagnosis not present

## 2022-03-15 DIAGNOSIS — H524 Presbyopia: Secondary | ICD-10-CM | POA: Diagnosis not present

## 2022-05-10 ENCOUNTER — Ambulatory Visit: Payer: Medicare HMO | Admitting: Nurse Practitioner

## 2022-05-18 NOTE — Progress Notes (Unsigned)
Referring Physician:  Jonetta Osgood, NP [place],  [place] [id number]  Primary Physician:  Lavera Guise, MD  Procedure: L5-S1 lumbar decompression Date of Procedure: 06/18/2018 Diagnosis: Lumbar radiculopathy   History of Present Illness: 05/18/2022*** Ms. Tangila Nelle has a history of mitral valve prolapse, HTN, GERD, skin CA, GAD.   Last seen by Dr. Izora Ribas on 11/27/18- he was concerned about recurrent herniation versus chronic radiculopathy. MRI and EMG was ordered.   Duration: *** Location: *** Quality: *** Severity: ***  Precipitating: aggravated by *** Modifying factors: made better by *** Weakness: none Timing: *** Bowel/Bladder Dysfunction: none  Conservative measures:  Physical therapy: ***  Multimodal medical therapy including regular antiinflammatories: ***  Injections: *** epidural steroid injections  Past Surgery: ***  Cresenciano Lick has ***no symptoms of cervical myelopathy.  The symptoms are causing a significant impact on the patient's life.   Review of Systems:  A 10 point review of systems is negative, except for the pertinent positives and negatives detailed in the HPI.  Past Medical History: Past Medical History:  Diagnosis Date   Anxiety    Back pain    Bulging lumbar disc    Chest pain    Depression    GERD (gastroesophageal reflux disease)    History of kidney stones    Hypertension    Migraine    Sleep apnea    has never used a cpap machine   Varicose veins of legs    Vertigo    Vision problems     Past Surgical History: Past Surgical History:  Procedure Laterality Date   BREAST CYST ASPIRATION Left yrs ago   LUMBAR LAMINECTOMY/DECOMPRESSION MICRODISCECTOMY Left 06/18/2018   Procedure: LUMBAR LAMINECTOMY/DECOMPRESSION MICRODISCECTOMY 1 LEVEL, L5-S1  LEFT;  Surgeon: Meade Maw, MD;  Location: [facility] ORS;  Service: Neurosurgery;  Laterality: Left;   STENT PLACE LEFT URETER ([place] HX)     unsure  of  side   TONSILLECTOMY     tubiligation      Allergies: Allergies as of 05/21/2022 - Review Complete 01/20/2022  Allergen Reaction Noted   Propoxyphene Other (See Comments) 07/28/2014    Medications: Outpatient Encounter Medications as of 05/21/2022  Medication Sig   amLODipine (NORVASC) 10 MG tablet Take 1 tablet (10 mg total) by mouth daily.   DULoxetine (CYMBALTA) 30 MG capsule Take 1 capsule (30 mg total) by mouth daily.   ezetimibe (ZETIA) 10 MG tablet Take 1 tablet (10 mg total) by mouth daily.   ibuprofen (ADVIL) 200 MG tablet Take 400 mg by mouth every 6 (six) hours as needed.   latanoprost (XALATAN) 0.005 % ophthalmic solution Place 1 drop into both eyes at bedtime.   Multiple Vitamin (MULTIVITAMIN) tablet Take 1 tablet by mouth daily. chewable   omeprazole (PRILOSEC) 20 MG capsule Take 1 capsule by mouth once daily   pravastatin (PRAVACHOL) 40 MG tablet TAKE 1 TABLET BY MOUTH EVERY DAY   No facility-administered encounter medications on file as of 05/21/2022.    Social History: Social History   Tobacco Use   Smoking status: Former    Types: Cigarettes    Quit date: 2000    Years since quitting: 24.1   Smokeless tobacco: Never  Vaping Use   Vaping Use: Never used  Substance Use Topics   Alcohol use: Yes    Comment: socially   Drug use: No    Family Medical History: Family History  Problem Relation Age of Onset   Hypertension Mother  Cancer Father    Hypertension Father    Hyperlipidemia Father    Breast cancer Neg Hx     Physical Examination: There were no vitals filed for this visit.  General: Patient is well developed, well nourished, calm, collected, and in no apparent distress. Attention to examination is appropriate.  Respiratory: Patient is breathing without any difficulty.   NEUROLOGICAL:     Awake, alert, oriented to person, place, and time.  Speech is clear and fluent. Fund of knowledge is appropriate.   Cranial Nerves: Pupils equal  round and reactive to light.  Facial tone is symmetric.    *** ROM of cervical spine *** pain *** posterior cervical tenderness. *** tenderness in bilateral trapezial region.   *** ROM of lumbar spine *** pain *** posterior lumbar tenderness.   No abnormal lesions on exposed skin.   Strength: Side Biceps Triceps Deltoid Interossei Grip Wrist Ext. Wrist Flex.  R 5 5 5 5 5 5 5  $ L 5 5 5 5 5 5 5   $ Side Iliopsoas Quads Hamstring PF DF EHL  R 5 5 5 5 5 5  $ L 5 5 5 5 5 5   $ Reflexes are ***2+ and symmetric at the biceps, triceps, brachioradialis, patella and achilles.   Hoffman's is absent.  Clonus is not present.   Bilateral upper and lower extremity sensation is intact to light touch.     Gait is normal.   ***No difficulty with tandem gait.    Medical Decision Making  Imaging: None ***  I have personally reviewed the images and agree with the above interpretation.  Assessment and Plan: Ms. Franklyn is a pleasant 72 y.o. female has ***  Treatment options discussed with patient and following plan made:   - Order for physical therapy for *** spine ***. Patient to call to schedule appointment. *** - Continue current medications including ***. Reviewed dosing and side effects.  - Prescription for ***. Reviewed dosing and side effects. Take with food.  - Prescription for *** to take prn muscle spasms. Reviewed dosing and side effects. Discussed this can cause drowsiness.  - MRI of *** to further evaluate *** radiculopathy. No improvement time or medications (***).  - Referral to PM[facility] at [facility] to discuss possible *** injections.  - Will schedule phone visit to review MRI results once I get them back.   I spent a total of *** minutes in face-to-face and non-face-to-face activities related to this patient's care today including review of outside records, review of imaging, review of symptoms, physical exam, discussion of differential diagnosis, discussion of treatment options, and  documentation.   Thank you for involving me in the care of this patient.   Geronimo Boot PA-C Dept. of Neurosurgery

## 2022-05-21 ENCOUNTER — Encounter: Payer: Self-pay | Admitting: Orthopedic Surgery

## 2022-05-21 ENCOUNTER — Ambulatory Visit: Payer: Medicare HMO | Admitting: Orthopedic Surgery

## 2022-05-21 VITALS — BP 118/70 | Ht 63.0 in | Wt 178.6 lb

## 2022-05-21 DIAGNOSIS — Z9889 Other specified postprocedural states: Secondary | ICD-10-CM | POA: Diagnosis not present

## 2022-05-21 DIAGNOSIS — M5442 Lumbago with sciatica, left side: Secondary | ICD-10-CM | POA: Diagnosis not present

## 2022-05-21 DIAGNOSIS — M5416 Radiculopathy, lumbar region: Secondary | ICD-10-CM

## 2022-05-21 DIAGNOSIS — G8929 Other chronic pain: Secondary | ICD-10-CM | POA: Diagnosis not present

## 2022-05-21 NOTE — Patient Instructions (Signed)
It was so nice to see you today. Thank you so much for coming in.    I want to get an MRI of your lower back to look into things further. We will get this approved through your insurance and they will call you to schedule the appointment. Can do at [facility] or at [facility] in [place].   Once I get the MRI results back, we will call you to schedule a phone visit to review them.   Please do not hesitate to call if you have any questions or concerns. You can also message me in [place].   If you have not heard back about the MRI in the next week, please call the office so we can help you get it scheduled.   Geronimo Boot PA-C [phone]

## 2022-05-24 ENCOUNTER — Ambulatory Visit: Payer: Medicare HMO | Admitting: Nurse Practitioner

## 2022-05-24 DIAGNOSIS — S52542A Smith's fracture of left radius, initial encounter for closed fracture: Secondary | ICD-10-CM | POA: Diagnosis not present

## 2022-05-25 DIAGNOSIS — M85842 Other specified disorders of bone density and structure, left hand: Secondary | ICD-10-CM | POA: Diagnosis not present

## 2022-05-25 DIAGNOSIS — S52542A Smith's fracture of left radius, initial encounter for closed fracture: Secondary | ICD-10-CM | POA: Diagnosis not present

## 2022-05-25 DIAGNOSIS — M7989 Other specified soft tissue disorders: Secondary | ICD-10-CM | POA: Diagnosis not present

## 2022-05-28 DIAGNOSIS — Z4889 Encounter for other specified surgical aftercare: Secondary | ICD-10-CM | POA: Diagnosis not present

## 2022-05-28 NOTE — Telephone Encounter (Signed)
Noted the authorization is good until August 2024.

## 2022-05-29 DIAGNOSIS — S52572A Other intraarticular fracture of lower end of left radius, initial encounter for closed fracture: Secondary | ICD-10-CM | POA: Diagnosis not present

## 2022-05-29 DIAGNOSIS — S52532A Colles' fracture of left radius, initial encounter for closed fracture: Secondary | ICD-10-CM | POA: Diagnosis not present

## 2022-05-29 DIAGNOSIS — Y999 Unspecified external cause status: Secondary | ICD-10-CM | POA: Diagnosis not present

## 2022-05-29 DIAGNOSIS — G8918 Other acute postprocedural pain: Secondary | ICD-10-CM | POA: Diagnosis not present

## 2022-06-04 DIAGNOSIS — S52542D Smith's fracture of left radius, subsequent encounter for closed fracture with routine healing: Secondary | ICD-10-CM | POA: Diagnosis not present

## 2022-06-06 ENCOUNTER — Other Ambulatory Visit: Payer: Medicare HMO

## 2022-06-11 DIAGNOSIS — Z4889 Encounter for other specified surgical aftercare: Secondary | ICD-10-CM | POA: Diagnosis not present

## 2022-06-11 DIAGNOSIS — S52542D Smith's fracture of left radius, subsequent encounter for closed fracture with routine healing: Secondary | ICD-10-CM | POA: Diagnosis not present

## 2022-06-12 ENCOUNTER — Other Ambulatory Visit: Payer: Self-pay | Admitting: Internal Medicine

## 2022-06-12 ENCOUNTER — Other Ambulatory Visit: Payer: Self-pay | Admitting: Nurse Practitioner

## 2022-06-12 DIAGNOSIS — E782 Mixed hyperlipidemia: Secondary | ICD-10-CM

## 2022-06-12 DIAGNOSIS — S52542D Smith's fracture of left radius, subsequent encounter for closed fracture with routine healing: Secondary | ICD-10-CM | POA: Diagnosis not present

## 2022-06-14 ENCOUNTER — Ambulatory Visit: Payer: Medicare HMO | Admitting: Nurse Practitioner

## 2022-06-18 DIAGNOSIS — S52542D Smith's fracture of left radius, subsequent encounter for closed fracture with routine healing: Secondary | ICD-10-CM | POA: Diagnosis not present

## 2022-06-27 DIAGNOSIS — S52542D Smith's fracture of left radius, subsequent encounter for closed fracture with routine healing: Secondary | ICD-10-CM | POA: Diagnosis not present

## 2022-07-02 DIAGNOSIS — S52542D Smith's fracture of left radius, subsequent encounter for closed fracture with routine healing: Secondary | ICD-10-CM | POA: Diagnosis not present

## 2022-07-04 DIAGNOSIS — Z4889 Encounter for other specified surgical aftercare: Secondary | ICD-10-CM | POA: Diagnosis not present

## 2022-07-04 DIAGNOSIS — G5602 Carpal tunnel syndrome, left upper limb: Secondary | ICD-10-CM | POA: Diagnosis not present

## 2022-07-05 DIAGNOSIS — G5602 Carpal tunnel syndrome, left upper limb: Secondary | ICD-10-CM | POA: Diagnosis not present

## 2022-07-11 ENCOUNTER — Ambulatory Visit (INDEPENDENT_AMBULATORY_CARE_PROVIDER_SITE_OTHER): Payer: Medicare HMO | Admitting: Nurse Practitioner

## 2022-07-11 ENCOUNTER — Encounter: Payer: Self-pay | Admitting: Nurse Practitioner

## 2022-07-11 VITALS — BP 127/74 | HR 79 | Temp 98.5°F | Resp 16 | Ht 63.0 in | Wt 180.0 lb

## 2022-07-11 DIAGNOSIS — Z0001 Encounter for general adult medical examination with abnormal findings: Secondary | ICD-10-CM | POA: Diagnosis not present

## 2022-07-11 DIAGNOSIS — R635 Abnormal weight gain: Secondary | ICD-10-CM | POA: Diagnosis not present

## 2022-07-11 DIAGNOSIS — E559 Vitamin D deficiency, unspecified: Secondary | ICD-10-CM | POA: Diagnosis not present

## 2022-07-11 DIAGNOSIS — Z23 Encounter for immunization: Secondary | ICD-10-CM

## 2022-07-11 DIAGNOSIS — Z1231 Encounter for screening mammogram for malignant neoplasm of breast: Secondary | ICD-10-CM | POA: Diagnosis not present

## 2022-07-11 DIAGNOSIS — F411 Generalized anxiety disorder: Secondary | ICD-10-CM

## 2022-07-11 DIAGNOSIS — R69 Illness, unspecified: Secondary | ICD-10-CM | POA: Diagnosis not present

## 2022-07-11 DIAGNOSIS — E782 Mixed hyperlipidemia: Secondary | ICD-10-CM | POA: Diagnosis not present

## 2022-07-11 MED ORDER — TETANUS-DIPHTH-ACELL PERTUSSIS 5-2.5-18.5 LF-MCG/0.5 IM SUSP: 0.5000 mL | Freq: Once | INTRAMUSCULAR | 0 refills | Status: AC

## 2022-07-11 MED ORDER — PHENTERMINE HCL 37.5 MG PO TABS
37.5000 mg | ORAL_TABLET | Freq: Every day | ORAL | 0 refills | Status: DC
Start: 2022-07-11 — End: 2022-09-10

## 2022-07-11 MED ORDER — DULOXETINE HCL 20 MG PO CPEP: 20.0000 mg | ORAL_CAPSULE | Freq: Every day | ORAL | 3 refills | Status: DC

## 2022-07-11 MED ORDER — PNEUMOCOCCAL 20-VAL CONJ VACC 0.5 ML IM SUSY: 0.5000 mL | PREFILLED_SYRINGE | INTRAMUSCULAR | 0 refills | Status: AC

## 2022-07-11 NOTE — Progress Notes (Signed)
[facility] [place], [place] [address]  Internal MEDICINE  Office Visit Note  Patient Name: Patricia Frazier  [id number]  [id number]  Date of Service: 07/11/2022  Chief Complaint  Patient presents with   Depression   Gastroesophageal Reflux   Hypertension   Medicare Wellness    HPI Sh[place] presents for an annual well visit and physical exam.  Well-appearing 72 y.o. female with GERD, hypertension, high cholesterol and anxiety.  Routine CRC screening: due in 2027 Routine mammogram: due in june DEXA scan: done in 2007 Labs: due in july New or worsening pain: left wrist carpal tunnel Other concerns: Broke left wrist --- in February. Doing better, well healed       07/11/2022   10:42 AM 05/18/2021   11:13 AM  MMSE - Mini Mental State Exam  Orientation to time 0 5  Orientation to Place 5 5  Registration 3 3  Attention/ Calculation 5 5  Recall 3 3  Language- name 2 objects 2 2  Language- repeat 1 1  Language- follow 3 step command 3 3  Language- read & follow direction 1 1  Write a sentence 1 1  Copy design 1 1  Total score 25 30    Functional Status Survey: Is the patient deaf or have difficulty hearing?: No Does the patient have difficulty seeing, even when wearing glasses/contacts?: No Does the patient have difficulty concentrating, remembering, or making decisions?: No Does the patient have difficulty walking or climbing stairs?: No Does the patient have difficulty dressing or bathing?: No Does the patient have difficulty doing errands alone such as visiting a doctor's office or shopping?: No     02/21/2021    2:43 PM 05/18/2021   11:11 AM 06/29/2021    3:44 PM 11/21/2021    9:46 AM 07/11/2022   10:41 AM  [place] in the past year? 1 0 1 0 1  Was there an injury with Fall? 1  0  1  Fall Risk Category Calculator 2  1  2   Fall Risk Category (Retired) Moderate  Low    (RETIRED) Patient Fall Risk Level Low fall risk Low fall risk  Low fall risk    Patient at Risk for Falls Due to  No Fall Risks     Fall risk Follow up Falls evaluation completed Falls evaluation completed Falls evaluation completed  Falls evaluation completed       11/21/2021    9:46 AM  Depression screen PHQ 2/9  Decreased Interest 0  Down, Depressed, Hopeless 0  PHQ - 2 Score 0       Current Medication: Outpatient Encounter Medications as of 07/11/2022  Medication Sig   DULoxetine (CYMBALTA) 20 MG capsule Take 1 capsule (20 mg total) by mouth daily.   pneumococcal 20-valent conjugate vaccine (PREVNAR 20) 0.5 ML injection Inject 0.5 mLs into the muscle tomorrow at 10 am for 1 dose.   [DISCONTINUED] Tdap (BOOSTRIX) 5-2.5-18.5 LF-MCG/0.5 injection Inject 0.5 mLs into the muscle once.   amLODipine (NORVASC) 10 MG tablet Take 1 tablet (10 mg total) by mouth daily.   ezetimibe (ZETIA) 10 MG tablet TAKE 1 TABLET BY MOUTH EVERY DAY   FLUAD QUADRIVALENT 0.5 ML injection Inject 0.5 mLs into the muscle once.   ibuprofen (ADVIL) 200 MG tablet Take 400 mg by mouth every 6 (six) hours as needed.   latanoprost (XALATAN) 0.005 % ophthalmic solution Place 1 drop into both eyes at bedtime.   Multiple Vitamin (  MULTIVITAMIN) tablet Take 1 tablet by mouth daily. chewable   omeprazole (PRILOSEC) 20 MG capsule TAKE 1 CAPSULE BY MOUTH EVERY DAY   phentermine (ADIPEX-P) 37.5 MG tablet Take 1 tablet (37.5 mg total) by mouth daily before breakfast.   pravastatin (PRAVACHOL) 40 MG tablet TAKE 1 TABLET BY MOUTH EVERY DAY   SHINGRIX injection Inject 0.5 mLs into the muscle.   SPIKEVAX syringe Inject 0.5 mLs into the muscle once.   Tdap (BOOSTRIX) 5-2.5-18.5 LF-MCG/0.5 injection Inject 0.5 mLs into the muscle once for 1 dose.   [DISCONTINUED] DULoxetine (CYMBALTA) 30 MG capsule Take 1 capsule (30 mg total) by mouth daily.   No facility-administered encounter medications on file as of 07/11/2022.    Surgical History: Past Surgical History:  Procedure Laterality Date    BREAST CYST ASPIRATION Left yrs ago   LUMBAR LAMINECTOMY/DECOMPRESSION MICRODISCECTOMY Left 06/18/2018   Procedure: LUMBAR LAMINECTOMY/DECOMPRESSION MICRODISCECTOMY 1 LEVEL, L5-S1  LEFT;  Surgeon: Meade Maw, MD;  Location: [facility] ORS;  Service: Neurosurgery;  Laterality: Left;   STENT PLACE LEFT URETER ([place] HX)     unsure  of side   TONSILLECTOMY     tubiligation      Medical History: Past Medical History:  Diagnosis Date   Anxiety    Back pain    Bulging lumbar disc    Chest pain    Depression    GERD (gastroesophageal reflux disease)    History of kidney stones    Hypertension    Migraine    Sleep apnea    has never used a cpap machine   Varicose veins of legs    Vertigo    Vision problems     Family History: Family History  Problem Relation Age of Onset   Hypertension Mother    Cancer Father    Hypertension Father    Hyperlipidemia Father    Breast cancer Neg Hx     Social History   Socioeconomic History   Marital status: Married    Spouse name: Merry Proud   Number of children: Not on file   Years of education: Not on file   Highest education level: Not on file  Occupational History   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    Comment: [employer]  Tobacco Use   Smoking status: Former    Types: Cigarettes    Quit date: 2000    Years since quitting: 24.2   Smokeless tobacco: Never  Vaping Use   Vaping Use: Never used  Substance and Sexual Activity   Alcohol use: Yes    Comment: socially   Drug use: No   Sexual activity: Not Currently  Other Topics Concern   Not on file  Social History Narrative   Not on file   Social Determinants of Health   Financial Resource Strain: Not on file  Food Insecurity: Not on file  Transportation Needs: Not on file  Physical Activity: Not on file  Stress: Not on file  Social Connections: Not on file  Intimate Partner Violence: Not on file      Review of Systems  Constitutional:  Negative for activity change,  appetite change, chills, fatigue, fever and unexpected weight change.  HENT: Negative.  Negative for congestion, ear pain, rhinorrhea, sore throat and trouble swallowing.   Eyes: Negative.   Respiratory: Negative.  Negative for cough, chest tightness, shortness of breath and wheezing.   Cardiovascular: Negative.  Negative for chest pain and palpitations.  Gastrointestinal: Negative.  Negative for abdominal pain, blood  in stool, constipation, diarrhea, nausea and vomiting.  Endocrine: Negative.   Genitourinary: Negative.  Negative for difficulty urinating, dysuria, frequency, hematuria and urgency.  Musculoskeletal: Negative.  Negative for arthralgias, back pain, joint swelling, myalgias and neck pain.  Skin: Negative.  Negative for rash and wound.  Allergic/Immunologic: Negative.  Negative for immunocompromised state.  Neurological: Negative.  Negative for dizziness, seizures, numbness and headaches.  Hematological: Negative.   Psychiatric/Behavioral:  Negative for behavioral problems, self-injury and suicidal ideas. The patient is not nervous/anxious.     Vital Signs: BP 127/74   Pulse 79   Temp 98.5 F (36.9 C)   Resp 16   Ht 5\' 3"  (1.6 m)   Wt 180 lb (81.6 kg)   SpO2 96%   BMI 31.89 kg/m    Physical Exam Vitals reviewed.  Constitutional:      General: She is awake. She is not in acute distress.    Appearance: Normal appearance. She is well-developed and well-groomed. She is obese. She is not ill-appearing or diaphoretic.  HENT:     Head: Normocephalic and atraumatic.     Right Ear: Tympanic membrane, ear canal and external ear normal.     Left Ear: Tympanic membrane, ear canal and external ear normal.     Nose: Nose normal. No congestion or rhinorrhea.     Mouth/Throat:     Lips: Pink.     Mouth: Mucous membranes are moist.     Pharynx: Oropharynx is clear. Uvula midline. No oropharyngeal exudate or posterior oropharyngeal erythema.  Eyes:     General: Lids are normal.  Vision grossly intact. Gaze aligned appropriately. No scleral icterus.       Right eye: No discharge.        Left eye: No discharge.     Extraocular Movements: Extraocular movements intact.     Conjunctiva/sclera: Conjunctivae normal.     Pupils: Pupils are equal, round, and reactive to light.     Funduscopic exam:    Right eye: Red reflex present.        Left eye: Red reflex present. Neck:     Thyroid: No thyromegaly.     Vascular: No JVD.     Trachea: Trachea and phonation normal. No tracheal deviation.  Cardiovascular:     Rate and Rhythm: Normal rate and regular rhythm.     Pulses: Normal pulses.     Heart sounds: Normal heart sounds, S1 normal and S2 normal. No murmur heard.    No friction rub. No gallop.  Pulmonary:     Effort: Pulmonary effort is normal. No accessory muscle usage or respiratory distress.     Breath sounds: Normal breath sounds. No stridor. No wheezing or rales.  Chest:     Chest wall: No tenderness.     Comments: Declined clinical breast exam, patient gets annual mammograms.  Abdominal:     General: Bowel sounds are normal. There is no distension.     Palpations: Abdomen is soft. There is no shifting dullness, fluid wave, mass or pulsatile mass.     Tenderness: There is no abdominal tenderness. There is no guarding or rebound.  Musculoskeletal:        General: No tenderness or deformity. Normal range of motion.     Cervical back: Normal range of motion and neck supple.     Right lower leg: No edema.     Left lower leg: No edema.  Lymphadenopathy:     Cervical: No cervical adenopathy.  Skin:  General: Skin is warm and dry.     Capillary Refill: Capillary refill takes less than 2 seconds.     Coloration: Skin is not pale.     Findings: No erythema or rash.  Neurological:     Mental Status: She is alert and oriented to person, place, and time.     Cranial Nerves: No cranial nerve deficit.     Motor: No abnormal muscle tone.     Coordination:  Coordination normal.     Gait: Gait normal.     Deep Tendon Reflexes: Reflexes are normal and symmetric.  Psychiatric:        Mood and Affect: Mood normal.        Behavior: Behavior normal. Behavior is cooperative.        Thought Content: Thought content normal.        Judgment: Judgment normal.        Assessment/Plan: 1. Encounter for routine adult health examination with abnormal findings Age-appropriate preventive screenings and vaccinations discussed, annual physical exam completed. Routine labs for health maintenance ordered, see below. PHM updated.  - Lipid Profile - CMP14+EGFR - CBC with Differential/Platelet - TSH + free T4 - Vitamin D (25 hydroxy)  2. Vitamin D deficiency Routine lab ordered - Vitamin D (25 hydroxy)  3. Mixed hyperlipidemia Routine labs ordered for july - Lipid Profile - CMP14+EGFR - CBC with Differential/Platelet - TSH + free T4 - Vitamin D (25 hydroxy)  4. Abnormal weight gain Routine labs ordered, phentermine prescribed, follow up in 4 weeks  - Lipid Profile - CMP14+EGFR - CBC with Differential/Platelet - TSH + free T4 - Vitamin D (25 hydroxy) - phentermine (ADIPEX-P) 37.5 MG tablet; Take 1 tablet (37.5 mg total) by mouth daily before breakfast.  Dispense: 30 tablet; Refill: 0  5. Encounter for screening mammogram for malignant neoplasm of breast Routine mammogram ordered - MM 3D SCREENING MAMMOGRAM BILATERAL BREAST; Future  6. Need for vaccination - Tdap ([place]) 5-2.5-18.5 LF-MCG/0.5 injection; Inject 0.5 mLs into the muscle once for 1 dose.  Dispense: 0.5 mL; Refill: 0 - pneumococcal 20-valent conjugate vaccine (PREVNAR 20) 0.5 ML injection; Inject 0.5 mLs into the muscle tomorrow at 10 am for 1 dose.  Dispense: 0.5 mL; Refill: 0  7. GAD (generalized anxiety disorder) Decreased duloxetine dose - DULoxetine (CYMBALTA) 20 MG capsule; Take 1 capsule (20 mg total) by mouth daily.  Dispense: 30 capsule; Refill: 3      General  Counseling: Jozlin verbalizes understanding of the findings of todays visit and agrees with plan of treatment. I have discussed any further diagnostic evaluation that may be needed or ordered today. We also reviewed her medications today. she has been encouraged to call the office with any questions or concerns that should arise related to todays visit.    Orders Placed This Encounter  Procedures   MM 3D SCREENING MAMMOGRAM BILATERAL BREAST   Lipid Profile   CMP14+EGFR   CBC with Differential/Platelet   TSH + free T4   Vitamin D (25 hydroxy)    Meds ordered this encounter  Medications   Tdap (BOOSTRIX) 5-2.5-18.5 LF-MCG/0.5 injection    Sig: Inject 0.5 mLs into the muscle once for 1 dose.    Dispense:  0.5 mL    Refill:  0   pneumococcal 20-valent conjugate vaccine (PREVNAR 20) 0.5 ML injection    Sig: Inject 0.5 mLs into the muscle tomorrow at 10 am for 1 dose.    Dispense:  0.5 mL  Refill:  0   DULoxetine (CYMBALTA) 20 MG capsule    Sig: Take 1 capsule (20 mg total) by mouth daily.    Dispense:  30 capsule    Refill:  3   phentermine (ADIPEX-P) 37.5 MG tablet    Sig: Take 1 tablet (37.5 mg total) by mouth daily before breakfast.    Dispense:  30 tablet    Refill:  0    Fill today    Return in about 4 weeks (around 08/08/2022) for F/U, Weight loss, Lyah Millirons PCP.   Total time spent:30 Minutes Time spent includes review of chart, medications, test results, and follow up plan with the patient.   [place] Controlled Substance Database was reviewed by me.  This patient was seen by Jonetta Osgood, FNP-C in collaboration with Dr. Clayborn Bigness as a part of collaborative care agreement.  Alohilani Levenhagen R. Valetta Fuller, MSN, FNP-C Internal medicine

## 2022-07-12 ENCOUNTER — Telehealth: Payer: Self-pay | Admitting: Nurse Practitioner

## 2022-07-12 DIAGNOSIS — S52542S Smith's fracture of left radius, sequela: Secondary | ICD-10-CM

## 2022-07-13 DIAGNOSIS — S52542D Smith's fracture of left radius, subsequent encounter for closed fracture with routine healing: Secondary | ICD-10-CM | POA: Diagnosis not present

## 2022-07-16 ENCOUNTER — Telehealth: Payer: Self-pay | Admitting: Nurse Practitioner

## 2022-07-16 DIAGNOSIS — L814 Other melanin hyperpigmentation: Secondary | ICD-10-CM | POA: Diagnosis not present

## 2022-07-16 DIAGNOSIS — D2272 Melanocytic nevi of left lower limb, including hip: Secondary | ICD-10-CM | POA: Diagnosis not present

## 2022-07-16 DIAGNOSIS — Z85828 Personal history of other malignant neoplasm of skin: Secondary | ICD-10-CM | POA: Diagnosis not present

## 2022-07-16 DIAGNOSIS — D2261 Melanocytic nevi of right upper limb, including shoulder: Secondary | ICD-10-CM | POA: Diagnosis not present

## 2022-07-16 DIAGNOSIS — D225 Melanocytic nevi of trunk: Secondary | ICD-10-CM | POA: Diagnosis not present

## 2022-07-16 DIAGNOSIS — L570 Actinic keratosis: Secondary | ICD-10-CM | POA: Diagnosis not present

## 2022-07-16 DIAGNOSIS — D2262 Melanocytic nevi of left upper limb, including shoulder: Secondary | ICD-10-CM | POA: Diagnosis not present

## 2022-07-16 NOTE — Telephone Encounter (Signed)
error 

## 2022-07-16 NOTE — Telephone Encounter (Signed)
Christina from [employer] called 07/12/22 requesting a referral to Bone density for pt, pt had a fracture due to fall back in February, called and lvm to pt to schedule appt -nm

## 2022-07-20 DIAGNOSIS — S52542D Smith's fracture of left radius, subsequent encounter for closed fracture with routine healing: Secondary | ICD-10-CM | POA: Diagnosis not present

## 2022-07-24 NOTE — Addendum Note (Signed)
Addended by: Sallyanne Kuster on: 07/24/2022 04:56 PM   Modules accepted: Orders

## 2022-07-25 DIAGNOSIS — R42 Dizziness and giddiness: Secondary | ICD-10-CM | POA: Diagnosis not present

## 2022-07-25 DIAGNOSIS — Z4889 Encounter for other specified surgical aftercare: Secondary | ICD-10-CM | POA: Diagnosis not present

## 2022-07-25 DIAGNOSIS — G5602 Carpal tunnel syndrome, left upper limb: Secondary | ICD-10-CM | POA: Diagnosis not present

## 2022-07-25 DIAGNOSIS — M9901 Segmental and somatic dysfunction of cervical region: Secondary | ICD-10-CM | POA: Diagnosis not present

## 2022-07-25 DIAGNOSIS — R519 Headache, unspecified: Secondary | ICD-10-CM | POA: Diagnosis not present

## 2022-07-27 ENCOUNTER — Telehealth: Payer: Self-pay | Admitting: Nurse Practitioner

## 2022-07-27 DIAGNOSIS — R519 Headache, unspecified: Secondary | ICD-10-CM | POA: Diagnosis not present

## 2022-07-27 DIAGNOSIS — M9901 Segmental and somatic dysfunction of cervical region: Secondary | ICD-10-CM | POA: Diagnosis not present

## 2022-07-27 DIAGNOSIS — R42 Dizziness and giddiness: Secondary | ICD-10-CM | POA: Diagnosis not present

## 2022-07-30 ENCOUNTER — Ambulatory Visit (INDEPENDENT_AMBULATORY_CARE_PROVIDER_SITE_OTHER): Payer: Medicare HMO | Admitting: Nurse Practitioner

## 2022-07-30 ENCOUNTER — Encounter: Payer: Self-pay | Admitting: Nurse Practitioner

## 2022-07-30 VITALS — BP 128/82 | HR 72 | Temp 97.9°F | Resp 16 | Ht 63.0 in | Wt 175.2 lb

## 2022-07-30 DIAGNOSIS — R42 Dizziness and giddiness: Secondary | ICD-10-CM | POA: Diagnosis not present

## 2022-07-30 NOTE — Telephone Encounter (Signed)
Spoke with pt and made her appt  

## 2022-07-30 NOTE — Progress Notes (Signed)
[facility] [address] [place], [place] [address]  Internal MEDICINE  Office Visit Note  Patient Name: Patricia Frazier  [id number]  [id number]  Date of Service: 07/30/2022  Chief Complaint  Patient presents with   Acute Visit    Vertigo needs referral.      HPI Patricia Frazier presents for an acute sick visit for vertigo Dizziness  Nausea and vomiting as well Has been having increased BP at home, and has lost some weight due to vomiting. And nausea.  Has done epley and other maneuvers and nothing is helping.    Current Medication:  Outpatient Encounter Medications as of 07/30/2022  Medication Sig   amLODipine (NORVASC) 10 MG tablet Take 1 tablet (10 mg total) by mouth daily.   DULoxetine (CYMBALTA) 20 MG capsule Take 1 capsule (20 mg total) by mouth daily.   ezetimibe (ZETIA) 10 MG tablet TAKE 1 TABLET BY MOUTH EVERY DAY   FLUAD QUADRIVALENT 0.5 ML injection Inject 0.5 mLs into the muscle once.   ibuprofen (ADVIL) 200 MG tablet Take 400 mg by mouth every 6 (six) hours as needed.   latanoprost (XALATAN) 0.005 % ophthalmic solution Place 1 drop into both eyes at bedtime.   Multiple Vitamin (MULTIVITAMIN) tablet Take 1 tablet by mouth daily. chewable   omeprazole (PRILOSEC) 20 MG capsule TAKE 1 CAPSULE BY MOUTH EVERY DAY   phentermine (ADIPEX-P) 37.5 MG tablet Take 1 tablet (37.5 mg total) by mouth daily before breakfast.   pravastatin (PRAVACHOL) 40 MG tablet TAKE 1 TABLET BY MOUTH EVERY DAY   SHINGRIX injection Inject 0.5 mLs into the muscle.   SPIKEVAX syringe Inject 0.5 mLs into the muscle once.   No facility-administered encounter medications on file as of 07/30/2022.      Medical History: Past Medical History:  Diagnosis Date   Anxiety    Back pain    Bulging lumbar disc    Chest pain    Depression    GERD (gastroesophageal reflux disease)    History of kidney stones    Hypertension    Migraine    Sleep apnea    has never used a cpap machine    Varicose veins of legs    Vertigo    Vision problems      Vital Signs: BP 128/82 Comment: 158/89  Pulse 72   Temp 97.9 F (36.6 C)   Resp 16   Ht  (1.6 m)   Wt 175 lb 3.2 oz (79.5 kg)   SpO2 99%   BMI 31.04 kg/m    Review of Systems  Constitutional: Negative.   HENT: Negative.    Respiratory: Negative.  Negative for cough, chest tightness, shortness of breath and wheezing.   Cardiovascular: Negative.  Negative for chest pain and palpitations.  Gastrointestinal: Negative.   Neurological:  Positive for dizziness, light-headedness and headaches.    Physical Exam Vitals reviewed.  Constitutional:      General: She is not in acute distress.    Appearance: Normal appearance. She is obese. She is not ill-appearing.  HENT:     Head: Normocephalic and atraumatic.  Eyes:     Pupils: Pupils are equal, round, and reactive to light.  Cardiovascular:     Rate and Rhythm: Normal rate and regular rhythm.  Pulmonary:     Effort: Pulmonary effort is normal. No respiratory distress.  Neurological:     Mental Status: She is alert and oriented to person, place, and time.  Psychiatric:  Mood and Affect: Mood normal.        Behavior: Behavior normal.       Assessment/Plan: 1. Vertigo Referred to vertigo specialist with [facility].  - Ambulatory referral to Physical Therapy   General Counseling: Patricia Frazier understanding of the findings of todays visit and agrees with plan of treatment. I have discussed any further diagnostic evaluation that may be needed or ordered today. We also reviewed her medications today. she has been encouraged to call the office with any questions or concerns that should arise related to todays visit.    Counseling:    Orders Placed This Encounter  Procedures   Ambulatory referral to Physical Therapy    No orders of the defined types were placed in this encounter.   Return for reschedule may appt for late may  or early june. .  [place] Controlled Substance Database was reviewed by me for overdose risk score (ORS)  Time spent:20 Minutes Time spent with patient included reviewing progress notes, labs, imaging studies, and discussing plan for follow up.   This patient was seen by Sallyanne Kuster, FNP-C in collaboration with Dr. Beverely Risen as a part of collaborative care agreement.  Philipe Laswell R. Tedd Sias, MSN, FNP-C Internal Medicine

## 2022-08-01 DIAGNOSIS — S52542D Smith's fracture of left radius, subsequent encounter for closed fracture with routine healing: Secondary | ICD-10-CM | POA: Diagnosis not present

## 2022-08-03 ENCOUNTER — Other Ambulatory Visit: Payer: Self-pay | Admitting: Nurse Practitioner

## 2022-08-03 DIAGNOSIS — F411 Generalized anxiety disorder: Secondary | ICD-10-CM

## 2022-08-06 DIAGNOSIS — Z0100 Encounter for examination of eyes and vision without abnormal findings: Secondary | ICD-10-CM | POA: Diagnosis not present

## 2022-08-06 DIAGNOSIS — H2513 Age-related nuclear cataract, bilateral: Secondary | ICD-10-CM | POA: Diagnosis not present

## 2022-08-06 DIAGNOSIS — H5213 Myopia, bilateral: Secondary | ICD-10-CM | POA: Diagnosis not present

## 2022-08-06 DIAGNOSIS — H401231 Low-tension glaucoma, bilateral, mild stage: Secondary | ICD-10-CM | POA: Diagnosis not present

## 2022-08-06 DIAGNOSIS — H43813 Vitreous degeneration, bilateral: Secondary | ICD-10-CM | POA: Diagnosis not present

## 2022-08-06 DIAGNOSIS — H52223 Regular astigmatism, bilateral: Secondary | ICD-10-CM | POA: Diagnosis not present

## 2022-08-07 DIAGNOSIS — H8111 Benign paroxysmal vertigo, right ear: Secondary | ICD-10-CM | POA: Diagnosis not present

## 2022-08-08 ENCOUNTER — Ambulatory Visit: Payer: Medicare HMO | Admitting: Nurse Practitioner

## 2022-08-08 ENCOUNTER — Encounter: Payer: Self-pay | Admitting: Orthopedic Surgery

## 2022-08-10 DIAGNOSIS — H8111 Benign paroxysmal vertigo, right ear: Secondary | ICD-10-CM | POA: Diagnosis not present

## 2022-08-13 DIAGNOSIS — H8111 Benign paroxysmal vertigo, right ear: Secondary | ICD-10-CM | POA: Diagnosis not present

## 2022-09-02 ENCOUNTER — Other Ambulatory Visit: Payer: Self-pay | Admitting: Nurse Practitioner

## 2022-09-02 DIAGNOSIS — F411 Generalized anxiety disorder: Secondary | ICD-10-CM

## 2022-09-05 ENCOUNTER — Ambulatory Visit
Admission: RE | Admit: 2022-09-05 | Discharge: 2022-09-05 | Disposition: A | Discharge status: Home / Self Care | Payer: Medicare HMO | Source: Ambulatory Visit | Attending: Orthopedic Surgery | Admitting: Orthopedic Surgery

## 2022-09-05 DIAGNOSIS — M545 Low back pain, unspecified: Secondary | ICD-10-CM | POA: Diagnosis not present

## 2022-09-05 DIAGNOSIS — Z9889 Other specified postprocedural states: Secondary | ICD-10-CM

## 2022-09-05 DIAGNOSIS — M4316 Spondylolisthesis, lumbar region: Secondary | ICD-10-CM | POA: Diagnosis not present

## 2022-09-05 DIAGNOSIS — G8929 Other chronic pain: Secondary | ICD-10-CM

## 2022-09-05 DIAGNOSIS — M47816 Spondylosis without myelopathy or radiculopathy, lumbar region: Secondary | ICD-10-CM | POA: Diagnosis not present

## 2022-09-05 DIAGNOSIS — M5416 Radiculopathy, lumbar region: Secondary | ICD-10-CM | POA: Diagnosis not present

## 2022-09-05 DIAGNOSIS — M9963 Osseous and subluxation stenosis of intervertebral foramina of lumbar region: Secondary | ICD-10-CM | POA: Diagnosis not present

## 2022-09-05 DIAGNOSIS — S22070A Wedge compression fracture of T9-T10 vertebra, initial encounter for closed fracture: Secondary | ICD-10-CM | POA: Diagnosis not present

## 2022-09-10 ENCOUNTER — Ambulatory Visit (INDEPENDENT_AMBULATORY_CARE_PROVIDER_SITE_OTHER): Payer: Medicare HMO | Admitting: Nurse Practitioner

## 2022-09-10 ENCOUNTER — Encounter: Payer: Self-pay | Admitting: Nurse Practitioner

## 2022-09-10 VITALS — BP 135/76 | HR 75 | Temp 98.6°F | Resp 16 | Ht 63.0 in | Wt 177.4 lb

## 2022-09-10 DIAGNOSIS — R42 Dizziness and giddiness: Secondary | ICD-10-CM | POA: Diagnosis not present

## 2022-09-10 DIAGNOSIS — R0982 Postnasal drip: Secondary | ICD-10-CM | POA: Diagnosis not present

## 2022-09-10 DIAGNOSIS — I10 Essential (primary) hypertension: Secondary | ICD-10-CM

## 2022-09-10 DIAGNOSIS — R635 Abnormal weight gain: Secondary | ICD-10-CM

## 2022-09-10 MED ORDER — PHENTERMINE HCL 37.5 MG PO TABS
37.5000 mg | ORAL_TABLET | Freq: Every day | ORAL | 1 refills | Status: DC
Start: 2022-09-10 — End: 2022-11-10

## 2022-09-10 MED ORDER — AMLODIPINE BESYLATE 10 MG PO TABS
10.0000 mg | ORAL_TABLET | Freq: Every day | ORAL | 2 refills | Status: DC
Start: 2022-09-10 — End: 2023-03-14

## 2022-09-10 NOTE — Progress Notes (Signed)
[facility] [address] [place], [place] [address]  Internal MEDICINE  Office Visit Note  Patient Name: Patricia Frazier  [id number]  [id number]  Date of Service: 09/10/2022  Chief Complaint  Patient presents with   Gastroesophageal Reflux   Hypertension   Follow-up    Weight loss     HPI Patricia Frazier presents for a follow-up visit for weight loss management, post nasal drip and lower extremity edema. Weight loss -- tolerating phentermine, denies any palpitations or other side effects on the medication. HR and BP are normal.  Post nasal drip - possibly causing cough/raspy voice, has not tried allergy medications/antihistamines, also has GERD Right leg swells sometimes -- off and on, elevates legs sometimes which helps.     Current Medication: Outpatient Encounter Medications as of 09/10/2022  Medication Sig   DULoxetine (CYMBALTA) 20 MG capsule TAKE 1 CAPSULE BY MOUTH EVERY DAY   ezetimibe (ZETIA) 10 MG tablet TAKE 1 TABLET BY MOUTH EVERY DAY   latanoprost (XALATAN) 0.005 % ophthalmic solution Place 1 drop into both eyes at bedtime.   Multiple Vitamin (MULTIVITAMIN) tablet Take 1 tablet by mouth daily. chewable   omeprazole (PRILOSEC) 20 MG capsule TAKE 1 CAPSULE BY MOUTH EVERY DAY   pravastatin (PRAVACHOL) 40 MG tablet TAKE 1 TABLET BY MOUTH EVERY DAY   [DISCONTINUED] amLODipine (NORVASC) 10 MG tablet Take 1 tablet (10 mg total) by mouth daily.   [DISCONTINUED] FLUAD QUADRIVALENT 0.5 ML injection Inject 0.5 mLs into the muscle once.   [DISCONTINUED] phentermine (ADIPEX-P) 37.5 MG tablet Take 1 tablet (37.5 mg total) by mouth daily before breakfast.   [DISCONTINUED] SHINGRIX injection Inject 0.5 mLs into the muscle.   [DISCONTINUED] SPIKEVAX syringe Inject 0.5 mLs into the muscle once.   amLODipine (NORVASC) 10 MG tablet Take 1 tablet (10 mg total) by mouth daily.   phentermine (ADIPEX-P) 37.5 MG tablet Take 1 tablet (37.5 mg total) by mouth daily before breakfast.    [DISCONTINUED] ibuprofen (ADVIL) 200 MG tablet Take 400 mg by mouth every 6 (six) hours as needed.   No facility-administered encounter medications on file as of 09/10/2022.    Surgical History: Past Surgical History:  Procedure Laterality Date   BREAST CYST ASPIRATION Left yrs ago   LUMBAR LAMINECTOMY/DECOMPRESSION MICRODISCECTOMY Left 06/18/2018   Procedure: LUMBAR LAMINECTOMY/DECOMPRESSION MICRODISCECTOMY 1 LEVEL, L5-S1  LEFT;  Surgeon: Venetia Night, MD;  Location: [facility] ORS;  Service: Neurosurgery;  Laterality: Left;   STENT PLACE LEFT URETER ([facility] HX)     unsure  of side   TONSILLECTOMY     tubiligation      Medical History: Past Medical History:  Diagnosis Date   Anxiety    Back pain    Bulging lumbar disc    Chest pain    Depression    GERD (gastroesophageal reflux disease)    History of kidney stones    Hypertension    Migraine    Sleep apnea    has never used a cpap machine   Varicose veins of legs    Vertigo    Vision problems     Family History: Family History  Problem Relation Age of Onset   Hypertension Mother    Cancer Father    Hypertension Father    Hyperlipidemia Father    Breast cancer Neg Hx     Social History   Socioeconomic History   Marital status: Married    Spouse name: Trey Paula   Number of children: Not on file  Years of education: Not on file   Highest education level: Not on file  Occupational History   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    Comment: [employer]  Tobacco Use   Smoking status: Former    Types: Cigarettes    Quit date: 2000    Years since quitting: 24.4   Smokeless tobacco: Never  Vaping Use   Vaping Use: Never used  Substance and Sexual Activity   Alcohol use: Yes    Comment: socially   Drug use: No   Sexual activity: Not Currently  Other Topics Concern   Not on file  Social History Narrative   Not on file   Social Determinants of Health   Financial Resource Strain: Not on file  Food Insecurity: Not  on file  Transportation Needs: Not on file  Physical Activity: Not on file  Stress: Not on file  Social Connections: Not on file  Intimate Partner Violence: Not on file      Review of Systems  Constitutional:  Positive for appetite change and unexpected weight change.  HENT:  Positive for postnasal drip. Negative for rhinorrhea, sinus pressure, sinus pain and sneezing.   Respiratory:  Positive for cough (off and on). Negative for chest tightness, shortness of breath and wheezing.   Cardiovascular: Negative.  Negative for chest pain and palpitations.  Gastrointestinal: Negative.   Neurological:  Positive for dizziness, light-headedness and headaches.    Vital Signs: BP 135/76 Comment: 146/74  Pulse 75   Temp 98.6 F (37 C)   Resp 16   Ht 5\' 3"  (1.6 m)   Wt 177 lb 6.4 oz (80.5 kg)   SpO2 94%   BMI 31.42 kg/m    Physical Exam Vitals reviewed.  Constitutional:      General: She is not in acute distress.    Appearance: Normal appearance. She is obese. She is not ill-appearing.  HENT:     Head: Normocephalic and atraumatic.  Eyes:     Pupils: Pupils are equal, round, and reactive to light.  Cardiovascular:     Rate and Rhythm: Normal rate and regular rhythm.  Pulmonary:     Effort: Pulmonary effort is normal. No respiratory distress.  Neurological:     Mental Status: She is alert and oriented to person, place, and time.  Psychiatric:        Mood and Affect: Mood normal.        Behavior: Behavior normal.        Assessment/Plan: 1. Essential (primary) hypertension Continue amlodipine as prescribed.  - amLODipine (NORVASC) 10 MG tablet; Take 1 tablet (10 mg total) by mouth daily.  Dispense: 90 tablet; Refill: 2  2. Post-nasal drip Will try OTC antihistamine  3. Vertigo Sees specialist  4. Abnormal weight gain Continue phentermine as prescribed for 8 weeks then follow up in office.  - phentermine (ADIPEX-P) 37.5 MG tablet; Take 1 tablet (37.5 mg total) by  mouth daily before breakfast.  Dispense: 30 tablet; Refill: 1   General Counseling: [name] verbalizes understanding of the findings of todays visit and agrees with plan of treatment. I have discussed any further diagnostic evaluation that may be needed or ordered today. We also reviewed her medications today. she has been encouraged to call the office with any questions or concerns that should arise related to todays visit.    No orders of the defined types were placed in this encounter.   Meds ordered this encounter  Medications   phentermine (ADIPEX-P) 37.5 MG  tablet    Sig: Take 1 tablet (37.5 mg total) by mouth daily before breakfast.    Dispense:  30 tablet    Refill:  1    Fill today   amLODipine (NORVASC) 10 MG tablet    Sig: Take 1 tablet (10 mg total) by mouth daily.    Dispense:  90 tablet    Refill:  2    For next fill, will not need refill until August.    Return in about 8 weeks (around 11/05/2022) for F/U, Weight loss, [facility] Alewine PCP.   Total time spent:30 Minutes Time spent includes review of chart, medications, test results, and follow up plan with the patient.   [place] Controlled Substance Database was reviewed by me.  This patient was seen by Sallyanne Kuster, FNP-C in collaboration with Dr. Beverely Risen as a part of collaborative care agreement.   Muhammad Vacca R. Tedd Sias, MSN, FNP-C Internal medicine

## 2022-09-20 ENCOUNTER — Telehealth: Payer: Self-pay | Admitting: Nurse Practitioner

## 2022-09-20 NOTE — Telephone Encounter (Addendum)
Received PT plan of care from [facility]. Gave to Alyssa for signature-Toni Plan of care faxed back to [facility]; [phone]. Scanned-Toni

## 2022-09-20 NOTE — Telephone Encounter (Signed)
Received fax from [facility]. Patient being discharged-Patricia Frazier

## 2022-09-25 NOTE — Progress Notes (Unsigned)
Telephone Visit- Progress Note: Referring Physician:  No referring provider defined for this encounter.  Primary Physician:  Sallyanne Kuster, NP  This visit was performed via telephone.  Patient location: home Provider location: office  I spent a total of 10 minutes non-face-to-face activities for this visit on the date of this encounter including review of current clinical condition and response to treatment.    Patient has given verbal consent to this telephone visits and we reviewed the limitations of a telephone visit. Patient wishes to proceed.    Chief Complaint:  review MRI results  History of Present Illness: Patricia Frazier is a 72 y.o. female has a history of mitral valve prolapse, HTN, GERD, skin CA, GAD.   History of L5-S1 lumbar decompression by Dr. Myer Haff on 06/18/18.    She had a few months relief after her surgery and then pain got worse. She'd had pain since that time. I last saw her on 05/21/22 and ordered a lumbar MRI.   She's had a wrist fracture with surgery and has to have more surgery next week (carpal tunnel release and maybe get one of the pins removed).   She continues with pain and heaviness in her lower back that is worse in the morning- it takes an hour or two until this improves. LBP is also worse with prolonged standing (cooking) or walking. She can't do much activity (longer than 30 minutes) without pain. Left leg pain has improved. She was getting more cramping in the leg and this got better after she drank more water.    She is taking motrin and alternates with tylenol.    Had some injections after her surgery at [facility] that only helped for a few days. She does HEP from previous PT.     Conservative measures:  Physical therapy: none since surgery  Multimodal medical therapy including regular antiinflammatories: motrin, tylenol  Injections: No recent epidural steroid injections. Did have some injections after surgery at [facility].     Past Surgery:  L5-S1 lumbar decompression by Dr. Myer Haff on 06/18/2018.   Exam: No exam done as this was a telephone encounter.     Imaging: Lumbar MRI dated 09/05/22:  FINDINGS: Segmentation: Five lumbar vertebrae are assumed and the caudal-most well-formed intervertebral disc space is designated L5-S1.   Alignment:  5 mm L4-L5 grade 1 anterolisthesis.   Vertebrae: Chronic T10 superior endplate vertebral compression deformity with superimposed Schmorl node (50% height loss). Small Schmorl node within the L5 inferior endplate. Vertebral body height is otherwise preserved at the imaged levels. Trace degenerate endplate edema at W0-J8.   Conus medullaris and cauda equina: Conus extends to the L1 level. No signal identified the visualized distal spinal cord.   Paraspinal and other soft tissues: No acute finding within included portions of the abdomen/retroperitoneum. No paraspinal mass or collection.   Disc levels:   Multilevel disc degeneration, greatest at L4-L5 (moderate) and L5-S1 (advanced).   T12-L1: Minimal facet arthrosis on the right. No significant disc herniation or stenosis.   L1-L2: Minimal facet arthrosis. No significant disc herniation or stenosis.   L2-L3: Disc bulge. Mild facet arthrosis with spurring. No significant spinal canal stenosis. Mild bilateral neural foraminal narrowing (greater on the left).   L3-L4: Disc bulge. Facet arthrosis (greater on the right and moderate on the right). Ligamentum flavum hypertrophy. Mild relative narrowing of the central canal posteriorly. Mild bilateral neural foramen narrowing.   L4-L5: 5 mm grade 1 anterolisthesis. Disc uncovering. Advanced facet arthrosis with ligamentum  flavum hypertrophy. Mild bilateral subarticular narrowing (without appreciable nerve root impingement). No significant foraminal stenosis.   L5-S1: Prior left laminectomy. Disc bulge with endplate osteophytes. Moderate-to-advanced facet  arthrosis. Mild bilateral subarticular narrowing. No significant central canal stenosis. Bilateral neural foraminal narrowing (mild to moderate right, mild left).   IMPRESSION: 1. Lumbar spondylosis as outlined and with findings most notably as follows. 2. At L5-S1, there has been prior left laminectomy. Advanced disc degeneration with minimal degenerative endplate edema. Disc bulge with endplate osteophytes. Moderate-to-advanced facet arthrosis. Mild bilateral subarticular narrowing. Bilateral neural foraminal narrowing (mild-to-moderate right, mild left). 3. At L4-L5, there is 5 mm grade 1 anterolisthesis. Disc uncovering. Advanced facet arthrosis with ligamentum flavum hypertrophy. Mild bilateral subarticular narrowing. No significant foraminal stenosis. 4. No more than mild spinal canal or neural foramen narrowing at the remaining lumbar levels. 5. Chronic T10 superior endplate vertebral compression fracture with superimposed Schmorl node (50% height loss).     Electronically Signed   By: Patricia Frazier D.O.   On: 09/15/2022 13:20  I have personally reviewed the images and agree with the above interpretation.  Assessment and Plan: [name] is a pleasant 72 y.o. female with history of L5-S1 lumbar decompression by Dr. Myer Haff on 06/18/2018. She had improvement for only a few months, then pain get worse.   She continues with pain and heaviness in her lower back that is worse in the morning- it takes an hour or two until this improves. LBP is also worse with prolonged standing (cooking) or walking. She can't do much activity (longer than 30 minutes) without pain. Left leg pain has improved.   She has mild central stenosis at L3-L4 with mild bilateral foraminal stenosis. Slip at L4-L5 with significant facet hypertrophy and bilateral foraminal stenosis L5-S1.   Her LBP is likely facet mediated.   Treatment options discussed with patient and following plan made:   - Referral to  PMR at [facility] to discuss lumbar injections. She would like to be seen in [place] if possible.  - Okay to continue HEP from previous PT.  - She would like to avoid further lumbar surgery if at all possible- may be candidate for SCS if no relief with injections.  - Phone follow up with me in 6-8 weeks. If she is doing worse, then she will need to come in for an appointment.   Drake Leach PA-C Neurosurgery

## 2022-09-26 ENCOUNTER — Ambulatory Visit (INDEPENDENT_AMBULATORY_CARE_PROVIDER_SITE_OTHER): Payer: Medicare HMO | Admitting: Orthopedic Surgery

## 2022-09-26 ENCOUNTER — Encounter: Payer: Self-pay | Admitting: Orthopedic Surgery

## 2022-09-26 DIAGNOSIS — Z9889 Other specified postprocedural states: Secondary | ICD-10-CM | POA: Diagnosis not present

## 2022-09-26 DIAGNOSIS — M48061 Spinal stenosis, lumbar region without neurogenic claudication: Secondary | ICD-10-CM | POA: Diagnosis not present

## 2022-09-26 DIAGNOSIS — M4316 Spondylolisthesis, lumbar region: Secondary | ICD-10-CM | POA: Diagnosis not present

## 2022-09-26 DIAGNOSIS — M47816 Spondylosis without myelopathy or radiculopathy, lumbar region: Secondary | ICD-10-CM

## 2022-09-26 NOTE — Addendum Note (Signed)
Addended byDrake Leach on: 09/26/2022 11:00 AM   Modules accepted: Orders

## 2022-09-27 DIAGNOSIS — Z01818 Encounter for other preprocedural examination: Secondary | ICD-10-CM | POA: Diagnosis not present

## 2022-09-27 DIAGNOSIS — G5602 Carpal tunnel syndrome, left upper limb: Secondary | ICD-10-CM | POA: Diagnosis not present

## 2022-10-01 ENCOUNTER — Ambulatory Visit
Admission: RE | Admit: 2022-10-01 | Discharge: 2022-10-01 | Disposition: A | Discharge status: Home / Self Care | Payer: Medicare HMO | Source: Ambulatory Visit | Attending: Nurse Practitioner | Admitting: Nurse Practitioner

## 2022-10-01 DIAGNOSIS — Z780 Asymptomatic menopausal state: Secondary | ICD-10-CM | POA: Diagnosis not present

## 2022-10-01 DIAGNOSIS — S52542S Smith's fracture of left radius, sequela: Secondary | ICD-10-CM | POA: Insufficient documentation

## 2022-10-01 DIAGNOSIS — M8589 Other specified disorders of bone density and structure, multiple sites: Secondary | ICD-10-CM | POA: Insufficient documentation

## 2022-10-02 NOTE — Progress Notes (Signed)
Dexa scan shows osteopenia which is low bone mass of the spine and the right femur. I recommend calcium with vitamin D supplement if not already taking one and engaging in light weight bearing exercise

## 2022-10-03 ENCOUNTER — Telehealth: Payer: Self-pay

## 2022-10-03 NOTE — Telephone Encounter (Signed)
-----   Message from Sallyanne Kuster, NP sent at 10/02/2022  7:37 AM EDT ----- Dexa scan shows osteopenia which is low bone mass of the spine and the right femur. I recommend calcium with vitamin D supplement if not already taking one and engaging in light weight bearing exercise

## 2022-10-03 NOTE — Telephone Encounter (Signed)
Patient notified

## 2022-10-04 DIAGNOSIS — G5602 Carpal tunnel syndrome, left upper limb: Secondary | ICD-10-CM | POA: Diagnosis not present

## 2022-10-18 ENCOUNTER — Ambulatory Visit
Admission: RE | Admit: 2022-10-18 | Discharge: 2022-10-18 | Disposition: A | Discharge status: Home / Self Care | Payer: Medicare HMO | Source: Ambulatory Visit | Attending: Nurse Practitioner | Admitting: Nurse Practitioner

## 2022-10-18 DIAGNOSIS — Z1231 Encounter for screening mammogram for malignant neoplasm of breast: Secondary | ICD-10-CM | POA: Diagnosis not present

## 2022-10-25 DIAGNOSIS — Z0001 Encounter for general adult medical examination with abnormal findings: Secondary | ICD-10-CM | POA: Diagnosis not present

## 2022-10-25 DIAGNOSIS — E559 Vitamin D deficiency, unspecified: Secondary | ICD-10-CM | POA: Diagnosis not present

## 2022-10-25 DIAGNOSIS — I10 Essential (primary) hypertension: Secondary | ICD-10-CM | POA: Diagnosis not present

## 2022-10-25 DIAGNOSIS — R635 Abnormal weight gain: Secondary | ICD-10-CM | POA: Diagnosis not present

## 2022-10-25 DIAGNOSIS — E663 Overweight: Secondary | ICD-10-CM | POA: Diagnosis not present

## 2022-10-25 DIAGNOSIS — E782 Mixed hyperlipidemia: Secondary | ICD-10-CM | POA: Diagnosis not present

## 2022-10-25 DIAGNOSIS — F411 Generalized anxiety disorder: Secondary | ICD-10-CM | POA: Diagnosis not present

## 2022-10-26 LAB — CMP14+EGFR
ALT: 25 IU/L (ref 0–32)
AST: 31 IU/L (ref 0–40)
Albumin: 4.6 g/dL (ref 3.8–4.8)
Alkaline Phosphatase: 79 IU/L (ref 44–121)
BUN/Creatinine Ratio: 19 (ref 12–28)
BUN: 16 mg/dL (ref 8–27)
Bilirubin Total: 0.5 mg/dL (ref 0.0–1.2)
CO2: 24 mmol/L (ref 20–29)
Calcium: 10.1 mg/dL (ref 8.7–10.3)
Chloride: 99 mmol/L (ref 96–106)
Creatinine, Ser: 0.85 mg/dL (ref 0.57–1.00)
Globulin, Total: 2.2 g/dL (ref 1.5–4.5)
Glucose: 99 mg/dL (ref 70–99)
Potassium: 5.1 mmol/L (ref 3.5–5.2)
Sodium: 139 mmol/L (ref 134–144)
Total Protein: 6.8 g/dL (ref 6.0–8.5)
eGFR: 72 mL/min/{1.73_m2} (ref >59)

## 2022-10-26 LAB — CBC WITH DIFFERENTIAL/PLATELET
Basophils Absolute: 0 10*3/uL (ref 0.0–0.2)
Basos: 1 %
EOS (ABSOLUTE): 0.1 10*3/uL (ref 0.0–0.4)
Eos: 2 %
Hematocrit: 42.5 % (ref 34.0–46.6)
Hemoglobin: 14.2 g/dL (ref 11.1–15.9)
Immature Grans (Abs): 0 10*3/uL (ref 0.0–0.1)
Immature Granulocytes: 0 %
Lymphocytes Absolute: 0.9 10*3/uL (ref 0.7–3.1)
Lymphs: 16 %
MCH: 30.6 pg (ref 26.6–33.0)
MCHC: 33.4 g/dL (ref 31.5–35.7)
MCV: 92 fL (ref 79–97)
Monocytes Absolute: 0.5 10*3/uL (ref 0.1–0.9)
Monocytes: 9 %
Neutrophils Absolute: 4 10*3/uL (ref 1.4–7.0)
Neutrophils: 72 %
Platelets: 264 10*3/uL (ref 150–450)
RBC: 4.64 x10E6/uL (ref 3.77–5.28)
RDW: 14 % (ref 11.7–15.4)
WBC: 5.6 10*3/uL (ref 3.4–10.8)

## 2022-10-26 LAB — TSH+FREE T4
Free T4: 1.22 ng/dL (ref 0.82–1.77)
TSH: 0.895 u[IU]/mL (ref 0.450–4.500)

## 2022-10-26 LAB — LIPID PANEL
Chol/HDL Ratio: 2.5 ratio (ref 0.0–4.4)
Cholesterol, Total: 218 mg/dL — ABNORMAL HIGH (ref 100–199)
HDL: 86 mg/dL (ref >39)
LDL Chol Calc (NIH): 114 mg/dL — ABNORMAL HIGH (ref 0–99)
Triglycerides: 102 mg/dL (ref 0–149)
VLDL Cholesterol Cal: 18 mg/dL (ref 5–40)

## 2022-10-26 LAB — VITAMIN D 25 HYDROXY (VIT D DEFICIENCY, FRACTURES): Vit D, 25-Hydroxy: 51.4 ng/mL (ref 30.0–100.0)

## 2022-10-27 NOTE — Progress Notes (Signed)
Will discuss lab results at upcoming office visit on 11/07/22

## 2022-11-02 NOTE — Progress Notes (Unsigned)
   Telephone Visit- Progress Note: Referring Physician:  Sallyanne Kuster, NP [address] [place],  [place] [address]  Primary Physician:  Sallyanne Kuster, NP  This visit was performed via telephone.  Patient location: home Provider location: office  I spent a total of 5 minutes non-face-to-face activities for this visit on the date of this encounter including review of current clinical condition and response to treatment.    Patient has given verbal consent to this telephone visits and we reviewed the limitations of a telephone visit. Patient wishes to proceed.    Chief Complaint:  recheck  History of Present Illness: Patricia Frazier is a 72 y.o. female has a history of mitral valve prolapse, HTN, GERD, skin CA, GAD.   History of L5-S1 lumbar decompression by Dr. Myer Haff on 06/18/18.    She had a few months relief after her surgery and then pain got worse. She has mild central stenosis at L3-L4 with mild bilateral foraminal stenosis. Slip at L4-L5 with significant facet hypertrophy and bilateral foraminal stenosis L5-S1. Her LBP is likely facet mediated.   She's had a wrist fracture with surgery and was getting ready to have more surgery at her last visit with me. She had repeat carpal tunnel release.   She was referred to PMR to discuss injections at her last visit. She saw Dr. Mariah Milling yesterday and is scheduled for bilateral L5-S1 TF ESI.  Her symptoms are about the same. She continues with pain and heaviness in her lower back that is worse in the morning and better once she gets moving. Her LBP is also worse with prolonged standing (cooking) or walking for longer than 30 minutes. Not having any left leg pain.     She has started tylenol arthritis. She is not taking motrin. She does HEP from previous PT.   No bowel or bladder issues.     Conservative measures:  Physical therapy: none since surgery  Multimodal medical therapy including regular antiinflammatories:  motrin, tylenol  Injections: No recent epidural steroid injections. Did have some injections after surgery at [facility].    Past Surgery:  L5-S1 lumbar decompression by Dr. Myer Haff on 06/18/2018.   Exam: No exam done as this was a telephone encounter.     Imaging: none  Assessment and Plan: Patricia Frazier is a pleasant 72 y.o. female with history of L5-S1 lumbar decompression by Dr. Myer Haff on 06/18/2018. She had improvement for only a few months, then pain get worse.   She continues with pain and heaviness in her lower back that is worse in the morning and worse with prolonged standing (cooking) or walking for more than 30 minutes.  She has mild central stenosis at L3-L4 with mild bilateral foraminal stenosis. Slip at L4-L5 with significant facet hypertrophy and bilateral foraminal stenosis L5-S1.   Treatment options discussed with patient and following plan made:   - She will have injections with Dr. Mariah Milling- scheduled for bilateral L5-S1 TF ESI.  - Okay to continue HEP from previous PT.  - She would like to avoid further lumbar surgery if at all possible- may be candidate for SCS if no relief with injections.  - Follow up with PMR for now, but will see me back as needed.   Drake Leach PA-C Neurosurgery

## 2022-11-05 ENCOUNTER — Ambulatory Visit: Payer: Medicare HMO | Admitting: Nurse Practitioner

## 2022-11-05 DIAGNOSIS — M5442 Lumbago with sciatica, left side: Secondary | ICD-10-CM | POA: Diagnosis not present

## 2022-11-05 DIAGNOSIS — G8929 Other chronic pain: Secondary | ICD-10-CM | POA: Diagnosis not present

## 2022-11-05 DIAGNOSIS — M5441 Lumbago with sciatica, right side: Secondary | ICD-10-CM | POA: Diagnosis not present

## 2022-11-05 DIAGNOSIS — M5416 Radiculopathy, lumbar region: Secondary | ICD-10-CM | POA: Diagnosis not present

## 2022-11-06 ENCOUNTER — Encounter: Payer: Self-pay | Admitting: Orthopedic Surgery

## 2022-11-06 ENCOUNTER — Ambulatory Visit (INDEPENDENT_AMBULATORY_CARE_PROVIDER_SITE_OTHER): Payer: Medicare HMO | Admitting: Orthopedic Surgery

## 2022-11-06 DIAGNOSIS — M47816 Spondylosis without myelopathy or radiculopathy, lumbar region: Secondary | ICD-10-CM | POA: Diagnosis not present

## 2022-11-06 DIAGNOSIS — M4316 Spondylolisthesis, lumbar region: Secondary | ICD-10-CM

## 2022-11-06 DIAGNOSIS — M48061 Spinal stenosis, lumbar region without neurogenic claudication: Secondary | ICD-10-CM

## 2022-11-06 DIAGNOSIS — Z9889 Other specified postprocedural states: Secondary | ICD-10-CM | POA: Diagnosis not present

## 2022-11-07 ENCOUNTER — Ambulatory Visit (INDEPENDENT_AMBULATORY_CARE_PROVIDER_SITE_OTHER): Payer: Medicare HMO | Admitting: Nurse Practitioner

## 2022-11-07 VITALS — BP 132/86 | HR 98 | Temp 98.3°F | Resp 16 | Ht 63.0 in | Wt 175.4 lb

## 2022-11-07 DIAGNOSIS — I10 Essential (primary) hypertension: Secondary | ICD-10-CM

## 2022-11-07 DIAGNOSIS — Z6831 Body mass index (BMI) 31.0-31.9, adult: Secondary | ICD-10-CM

## 2022-11-07 DIAGNOSIS — R635 Abnormal weight gain: Secondary | ICD-10-CM

## 2022-11-07 DIAGNOSIS — E6609 Other obesity due to excess calories: Secondary | ICD-10-CM | POA: Diagnosis not present

## 2022-11-08 ENCOUNTER — Encounter: Payer: Self-pay | Admitting: Nurse Practitioner

## 2022-11-08 ENCOUNTER — Telehealth: Payer: Self-pay

## 2022-11-08 NOTE — Progress Notes (Signed)
[facility] [address] [place], [place] [address]  Internal MEDICINE  Office Visit Note  Patient Name: Patricia Frazier  [id number]  [id number]  Date of Service: 11/07/2022  Chief Complaint  Patient presents with   Follow-up    Weight loss     HPI Patricia Frazier presents for a follow-up visit for hypertension and weight loss management.  Lost 4-5 lbs with phentermine. Feeling well, tolerating medication, denies palpitations or other adverse side effects. Adding OTC chromium with no issues Slightly swelling still in right foot but it goes down at night.  Wants to continue the phentermine for weight loss.  BP controlled with amlodipine.     Current Medication: Outpatient Encounter Medications as of 11/07/2022  Medication Sig   amLODipine (NORVASC) 10 MG tablet Take 1 tablet (10 mg total) by mouth daily.   DULoxetine (CYMBALTA) 20 MG capsule TAKE 1 CAPSULE BY MOUTH EVERY DAY   ezetimibe (ZETIA) 10 MG tablet TAKE 1 TABLET BY MOUTH EVERY DAY   latanoprost (XALATAN) 0.005 % ophthalmic solution Place 1 drop into both eyes at bedtime.   Multiple Vitamin (MULTIVITAMIN) tablet Take 1 tablet by mouth daily. chewable   omeprazole (PRILOSEC) 20 MG capsule TAKE 1 CAPSULE BY MOUTH EVERY DAY   pravastatin (PRAVACHOL) 40 MG tablet TAKE 1 TABLET BY MOUTH EVERY DAY   [DISCONTINUED] phentermine (ADIPEX-P) 37.5 MG tablet Take 1 tablet (37.5 mg total) by mouth daily before breakfast.   phentermine (ADIPEX-P) 37.5 MG tablet Take 1 tablet (37.5 mg total) by mouth daily before breakfast.   No facility-administered encounter medications on file as of 11/07/2022.    Surgical History: Past Surgical History:  Procedure Laterality Date   BREAST CYST ASPIRATION Left yrs ago   LUMBAR LAMINECTOMY/DECOMPRESSION MICRODISCECTOMY Left 06/18/2018   Procedure: LUMBAR LAMINECTOMY/DECOMPRESSION MICRODISCECTOMY 1 LEVEL, L5-S1  LEFT;  Surgeon: Venetia Night, MD;  Location: [facility] ORS;  Service:  Neurosurgery;  Laterality: Left;   STENT PLACE LEFT URETER ([facility] HX)     unsure  of side   TONSILLECTOMY     tubiligation      Medical History: Past Medical History:  Diagnosis Date   Anxiety    Back pain    Bulging lumbar disc    Chest pain    Depression    GERD (gastroesophageal reflux disease)    History of kidney stones    Hypertension    Migraine    Sleep apnea    has never used a cpap machine   Varicose veins of legs    Vertigo    Vision problems     Family History: Family History  Problem Relation Age of Onset   Hypertension Mother    Cancer Father    Hypertension Father    Hyperlipidemia Father    Breast cancer Neg Hx     Social History   Socioeconomic History   Marital status: Married    Spouse name: Trey Paula   Number of children: Not on file   Years of education: Not on file   Highest education level: Not on file  Occupational History   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    Comment: [employer]  Tobacco Use   Smoking status: Former    Current packs/day: 0.00    Types: Cigarettes    Quit date: 2000    Years since quitting: 24.6   Smokeless tobacco: Never  Vaping Use   Vaping status: Never Used  Substance and Sexual Activity   Alcohol use: Yes  Comment: socially   Drug use: No   Sexual activity: Not Currently  Other Topics Concern   Not on file  Social History Narrative   Not on file   Social Determinants of Health   Financial Resource Strain: Not on file  Food Insecurity: Not on file  Transportation Needs: Not on file  Physical Activity: Not on file  Stress: Not on file  Social Connections: Not on file  Intimate Partner Violence: Not on file      Review of Systems  Constitutional:  Positive for appetite change and unexpected weight change.  HENT:  Positive for postnasal drip. Negative for rhinorrhea, sinus pressure, sinus pain and sneezing.   Respiratory:  Positive for cough (off and on). Negative for chest tightness, shortness  of breath and wheezing.   Cardiovascular: Negative.  Negative for chest pain and palpitations.  Gastrointestinal: Negative.   Neurological:  Positive for dizziness, light-headedness and headaches.    Vital Signs: BP 132/86   Pulse 98   Temp 98.3 F (36.8 C)   Resp 16   Ht 5\' 3"  (1.6 m)   Wt 175 lb 6.4 oz (79.6 kg)   SpO2 93%   BMI 31.07 kg/m    Physical Exam Vitals reviewed.  Constitutional:      General: She is not in acute distress.    Appearance: Normal appearance. She is obese. She is not ill-appearing.  HENT:     Head: Normocephalic and atraumatic.  Eyes:     Pupils: Pupils are equal, round, and reactive to light.  Cardiovascular:     Rate and Rhythm: Normal rate and regular rhythm.  Pulmonary:     Effort: Pulmonary effort is normal. No respiratory distress.  Neurological:     Mental Status: She is alert and oriented to person, place, and time.  Psychiatric:        Mood and Affect: Mood normal.        Behavior: Behavior normal.        Assessment/Plan: 1. Essential (primary) hypertension Stable, continue amlodipine as prescribed.   2. Abnormal weight gain Continue phentermine as prescribed. Follow up in 8 weeks.  - phentermine (ADIPEX-P) 37.5 MG tablet; Take 1 tablet (37.5 mg total) by mouth daily before breakfast.  Dispense: 30 tablet; Refill: 1  3. Class 1 obesity due to excess calories with serious comorbidity and body mass index (BMI) of 31.0 to 31.9 in adult Continue phentermine, follow up in 8 weeks    General Counseling: Patricia Frazier understanding of the findings of todays visit and agrees with plan of treatment. I have discussed any further diagnostic evaluation that may be needed or ordered today. We also reviewed her medications today. she has been encouraged to call the office with any questions or concerns that should arise related to todays visit.    No orders of the defined types were placed in this encounter.   Meds ordered this  encounter  Medications   phentermine (ADIPEX-P) 37.5 MG tablet    Sig: Take 1 tablet (37.5 mg total) by mouth daily before breakfast.    Dispense:  30 tablet    Refill:  1    Fill today    Return in about 8 weeks (around 01/02/2023) for F/U, Weight loss,  PCP.   Total time spent:30 Minutes Time spent includes review of chart, medications, test results, and follow up plan with the patient.   [place] Controlled Substance Database was reviewed by me.  This patient was seen by Arlyss Repress  , FNP-C in collaboration with Dr. Beverely Risen as a part of collaborative care agreement.    R. Tedd Sias, MSN, FNP-C Internal medicine

## 2022-11-08 NOTE — Telephone Encounter (Signed)
Pt advised as pr alyssa overall labs looks good cholesterol elevated watch diet and exercise

## 2022-11-10 ENCOUNTER — Encounter: Payer: Self-pay | Admitting: Nurse Practitioner

## 2022-11-10 MED ORDER — PHENTERMINE HCL 37.5 MG PO TABS
37.5000 mg | ORAL_TABLET | Freq: Every day | ORAL | 1 refills | Status: DC
Start: 2022-11-10 — End: 2022-12-24

## 2022-11-19 DIAGNOSIS — M5416 Radiculopathy, lumbar region: Secondary | ICD-10-CM | POA: Diagnosis not present

## 2022-11-30 ENCOUNTER — Other Ambulatory Visit: Payer: Self-pay | Admitting: Nurse Practitioner

## 2022-11-30 DIAGNOSIS — E782 Mixed hyperlipidemia: Secondary | ICD-10-CM

## 2022-12-03 DIAGNOSIS — G8929 Other chronic pain: Secondary | ICD-10-CM | POA: Diagnosis not present

## 2022-12-03 DIAGNOSIS — M5416 Radiculopathy, lumbar region: Secondary | ICD-10-CM | POA: Diagnosis not present

## 2022-12-03 DIAGNOSIS — M5441 Lumbago with sciatica, right side: Secondary | ICD-10-CM | POA: Diagnosis not present

## 2022-12-03 DIAGNOSIS — M5442 Lumbago with sciatica, left side: Secondary | ICD-10-CM | POA: Diagnosis not present

## 2022-12-04 ENCOUNTER — Other Ambulatory Visit: Payer: Self-pay | Admitting: Nurse Practitioner

## 2022-12-06 DIAGNOSIS — H2513 Age-related nuclear cataract, bilateral: Secondary | ICD-10-CM | POA: Diagnosis not present

## 2022-12-06 DIAGNOSIS — H401231 Low-tension glaucoma, bilateral, mild stage: Secondary | ICD-10-CM | POA: Diagnosis not present

## 2022-12-06 DIAGNOSIS — H43813 Vitreous degeneration, bilateral: Secondary | ICD-10-CM | POA: Diagnosis not present

## 2022-12-13 ENCOUNTER — Other Ambulatory Visit: Payer: Self-pay | Admitting: Nurse Practitioner

## 2022-12-13 DIAGNOSIS — E782 Mixed hyperlipidemia: Secondary | ICD-10-CM

## 2022-12-21 DIAGNOSIS — M5416 Radiculopathy, lumbar region: Secondary | ICD-10-CM | POA: Diagnosis not present

## 2022-12-24 ENCOUNTER — Ambulatory Visit (INDEPENDENT_AMBULATORY_CARE_PROVIDER_SITE_OTHER): Payer: Medicare HMO | Admitting: Nurse Practitioner

## 2022-12-24 ENCOUNTER — Encounter: Payer: Self-pay | Admitting: Nurse Practitioner

## 2022-12-24 VITALS — BP 132/84 | HR 95 | Temp 98.3°F | Resp 16 | Ht 63.0 in | Wt 173.8 lb

## 2022-12-24 DIAGNOSIS — R635 Abnormal weight gain: Secondary | ICD-10-CM | POA: Diagnosis not present

## 2022-12-24 DIAGNOSIS — E6609 Other obesity due to excess calories: Secondary | ICD-10-CM | POA: Diagnosis not present

## 2022-12-24 DIAGNOSIS — Z6831 Body mass index (BMI) 31.0-31.9, adult: Secondary | ICD-10-CM

## 2022-12-24 DIAGNOSIS — I10 Essential (primary) hypertension: Secondary | ICD-10-CM

## 2022-12-24 MED ORDER — PHENTERMINE HCL 37.5 MG PO TABS: 37.5000 mg | ORAL_TABLET | Freq: Every day | ORAL | 1 refills | Status: DC

## 2022-12-24 NOTE — Progress Notes (Signed)
[facility] [address] [place], [place] [address]  Internal MEDICINE  Office Visit Note  Patient Name: Patricia Frazier  [id number]  [id number]  Date of Service: 12/24/2022  Chief Complaint  Patient presents with   Depression   Gastroesophageal Reflux   Hypertension   Follow-up    HPI Janesse presents for a follow-up visit for hypertension and weight loss.  Blood pressure remains controlled with amlodipine.  Lost 2-3 more lbs since last office visit.  Walking 1.5 miles 4-5 days a week with a friend.  Going on a trip to [place] soon.  Denies any palpitations or other adverse side effects of phentermine.    Current Medication: Outpatient Encounter Medications as of 12/24/2022  Medication Sig   amLODipine (NORVASC) 10 MG tablet Take 1 tablet (10 mg total) by mouth daily.   DULoxetine (CYMBALTA) 20 MG capsule TAKE 1 CAPSULE BY MOUTH EVERY DAY   ezetimibe (ZETIA) 10 MG tablet TAKE 1 TABLET BY MOUTH EVERY DAY   latanoprost (XALATAN) 0.005 % ophthalmic solution Place 1 drop into both eyes at bedtime.   Multiple Vitamin (MULTIVITAMIN) tablet Take 1 tablet by mouth daily. chewable   omeprazole (PRILOSEC) 20 MG capsule TAKE 1 CAPSULE BY MOUTH EVERY DAY   pravastatin (PRAVACHOL) 40 MG tablet TAKE 1 TABLET BY MOUTH EVERY DAY   [DISCONTINUED] phentermine (ADIPEX-P) 37.5 MG tablet Take 1 tablet (37.5 mg total) by mouth daily before breakfast.   phentermine (ADIPEX-P) 37.5 MG tablet Take 1 tablet (37.5 mg total) by mouth daily before breakfast.   No facility-administered encounter medications on file as of 12/24/2022.    Surgical History: Past Surgical History:  Procedure Laterality Date   BREAST CYST ASPIRATION Left yrs ago   LUMBAR LAMINECTOMY/DECOMPRESSION MICRODISCECTOMY Left 06/18/2018   Procedure: LUMBAR LAMINECTOMY/DECOMPRESSION MICRODISCECTOMY 1 LEVEL, L5-S1  LEFT;  Surgeon: Venetia Night, MD;  Location: [facility] ORS;  Service: Neurosurgery;  Laterality: Left;    STENT PLACE LEFT URETER ([facility] HX)     unsure  of side   TONSILLECTOMY     tubiligation      Medical History: Past Medical History:  Diagnosis Date   Anxiety    Back pain    Bulging lumbar disc    Chest pain    Depression    GERD (gastroesophageal reflux disease)    History of kidney stones    Hypertension    Migraine    Sleep apnea    has never used a cpap machine   Varicose veins of legs    Vertigo    Vision problems     Family History: Family History  Problem Relation Age of Onset   Hypertension Mother    Cancer Father    Hypertension Father    Hyperlipidemia Father    Breast cancer Neg Hx     Social History   Socioeconomic History   Marital status: Married    Spouse name: Trey Paula   Number of children: Not on file   Years of education: Not on file   Highest education level: Not on file  Occupational History   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    Comment: [employer]  Tobacco Use   Smoking status: Former    Current packs/day: 0.00    Types: Cigarettes    Quit date: 2000    Years since quitting: 24.7   Smokeless tobacco: Never  Vaping Use   Vaping status: Never Used  Substance and Sexual Activity   Alcohol use: Yes  Comment: socially   Drug use: No   Sexual activity: Not Currently  Other Topics Concern   Not on file  Social History Narrative   Not on file   Social Determinants of Health   Financial Resource Strain: Not on file  Food Insecurity: Not on file  Transportation Needs: Not on file  Physical Activity: Not on file  Stress: Not on file  Social Connections: Not on file  Intimate Partner Violence: Not on file      Review of Systems  Constitutional:  Positive for appetite change and unexpected weight change.  HENT:  Negative for postnasal drip, rhinorrhea, sinus pressure, sinus pain and sneezing.   Respiratory:  Positive for cough (off and on). Negative for chest tightness, shortness of breath and wheezing.   Cardiovascular:  Negative.  Negative for chest pain and palpitations.  Gastrointestinal: Negative.   Neurological:  Negative for dizziness, light-headedness and headaches.    Vital Signs: BP 132/84   Pulse 95   Temp 98.3 F (36.8 C)   Resp 16   Ht 5\' 3"  (1.6 m)   Wt 173 lb 12.8 oz (78.8 kg)   SpO2 96%   BMI 30.79 kg/m    Physical Exam Vitals reviewed.  Constitutional:      General: She is not in acute distress.    Appearance: Normal appearance. She is obese. She is not ill-appearing.  HENT:     Head: Normocephalic and atraumatic.  Eyes:     Pupils: Pupils are equal, round, and reactive to light.  Cardiovascular:     Rate and Rhythm: Normal rate and regular rhythm.  Pulmonary:     Effort: Pulmonary effort is normal. No respiratory distress.  Neurological:     Mental Status: She is alert and oriented to person, place, and time.  Psychiatric:        Mood and Affect: Mood normal.        Behavior: Behavior normal.        Assessment/Plan: 1. Essential (primary) hypertension Stable, continue amlodipine as prescribed.   2. Abnormal weight gain Weight loss continued. Continue phentermine as prescribed continue diet and lifestyle modifications as discussed. Follow up in 8 weeks.  - phentermine (ADIPEX-P) 37.5 MG tablet; Take 1 tablet (37.5 mg total) by mouth daily before breakfast.  Dispense: 30 tablet; Refill: 1  3. Class 1 obesity due to excess calories with serious comorbidity and body mass index (BMI) of 31.0 to 31.9 in adult Continue phentermine as prescribed. Follow up in 8 weeks.    General Counseling: talmadge delconte understanding of the findings of todays visit and agrees with plan of treatment. I have discussed any further diagnostic evaluation that may be needed or ordered today. We also reviewed her medications today. she has been encouraged to call the office with any questions or concerns that should arise related to todays visit.    No orders of the defined types  were placed in this encounter.   Meds ordered this encounter  Medications   phentermine (ADIPEX-P) 37.5 MG tablet    Sig: Take 1 tablet (37.5 mg total) by mouth daily before breakfast.    Dispense:  30 tablet    Refill:  1    Fill today    Return in about 8 weeks (around 02/18/2023) for F/U, Weight loss, Kimari Lienhard PCP.   Total time spent:30 Minutes Time spent includes review of chart, medications, test results, and follow up plan with the patient.   [place] Controlled Substance Database was  reviewed by me.  This patient was seen by Sallyanne Kuster, FNP-C in collaboration with Dr. Beverely Risen as a part of collaborative care agreement.   Jeffree Cazeau R. Tedd Sias, MSN, FNP-C Internal medicine

## 2023-01-11 DIAGNOSIS — M47816 Spondylosis without myelopathy or radiculopathy, lumbar region: Secondary | ICD-10-CM | POA: Diagnosis not present

## 2023-01-11 DIAGNOSIS — M5442 Lumbago with sciatica, left side: Secondary | ICD-10-CM | POA: Diagnosis not present

## 2023-01-11 DIAGNOSIS — M5441 Lumbago with sciatica, right side: Secondary | ICD-10-CM | POA: Diagnosis not present

## 2023-01-11 DIAGNOSIS — G8929 Other chronic pain: Secondary | ICD-10-CM | POA: Diagnosis not present

## 2023-01-11 DIAGNOSIS — M5416 Radiculopathy, lumbar region: Secondary | ICD-10-CM | POA: Diagnosis not present

## 2023-01-18 DIAGNOSIS — M47816 Spondylosis without myelopathy or radiculopathy, lumbar region: Secondary | ICD-10-CM | POA: Diagnosis not present

## 2023-02-01 DIAGNOSIS — M47816 Spondylosis without myelopathy or radiculopathy, lumbar region: Secondary | ICD-10-CM | POA: Diagnosis not present

## 2023-02-01 DIAGNOSIS — M5416 Radiculopathy, lumbar region: Secondary | ICD-10-CM | POA: Diagnosis not present

## 2023-02-01 DIAGNOSIS — G8929 Other chronic pain: Secondary | ICD-10-CM | POA: Diagnosis not present

## 2023-02-01 DIAGNOSIS — M5441 Lumbago with sciatica, right side: Secondary | ICD-10-CM | POA: Diagnosis not present

## 2023-02-01 DIAGNOSIS — M5442 Lumbago with sciatica, left side: Secondary | ICD-10-CM | POA: Diagnosis not present

## 2023-02-18 ENCOUNTER — Ambulatory Visit: Payer: Medicare HMO | Admitting: Nurse Practitioner

## 2023-02-28 ENCOUNTER — Encounter: Payer: Self-pay | Admitting: Nurse Practitioner

## 2023-02-28 ENCOUNTER — Ambulatory Visit (INDEPENDENT_AMBULATORY_CARE_PROVIDER_SITE_OTHER): Payer: Medicare HMO | Admitting: Nurse Practitioner

## 2023-02-28 VITALS — BP 126/78 | HR 67 | Temp 98.3°F | Resp 16 | Ht 63.0 in | Wt 178.0 lb

## 2023-02-28 DIAGNOSIS — R635 Abnormal weight gain: Secondary | ICD-10-CM | POA: Diagnosis not present

## 2023-02-28 DIAGNOSIS — Z6831 Body mass index (BMI) 31.0-31.9, adult: Secondary | ICD-10-CM | POA: Diagnosis not present

## 2023-02-28 DIAGNOSIS — E6609 Other obesity due to excess calories: Secondary | ICD-10-CM

## 2023-02-28 DIAGNOSIS — F411 Generalized anxiety disorder: Secondary | ICD-10-CM | POA: Diagnosis not present

## 2023-02-28 DIAGNOSIS — R0982 Postnasal drip: Secondary | ICD-10-CM | POA: Diagnosis not present

## 2023-02-28 DIAGNOSIS — E66811 Obesity, class 1: Secondary | ICD-10-CM

## 2023-02-28 MED ORDER — PHENTERMINE HCL 37.5 MG PO TABS: 37.5000 mg | ORAL_TABLET | Freq: Every day | ORAL | 0 refills | Status: DC

## 2023-02-28 MED ORDER — BUSPIRONE HCL 5 MG PO TABS
5.0000 mg | ORAL_TABLET | Freq: Three times a day (TID) | ORAL | 0 refills | Status: DC
Start: 2023-02-28 — End: 2023-04-22

## 2023-02-28 NOTE — Progress Notes (Signed)
[facility] [address] [place], [place] [address]  Internal MEDICINE  Office Visit Note  Patient Name: Patricia Frazier  [id number]  [id number]  Date of Service: 02/28/2023  Chief Complaint  Patient presents with   Depression   Gastroesophageal Reflux   Hypertension   Follow-up    Weight loss     HPI Patricia Frazier presents for a follow-up visit for weight loss, anxiety and chronic cough.  Weight loss -- has been up in [place] for that past few weeks  Anxiety -- duloxetine is not helping. Wants to try buspirone. Chronic cough -- has been going on since last office visit in September. Wet cough, but nonproductive.    Current Medication: Outpatient Encounter Medications as of 02/28/2023  Medication Sig   amLODipine (NORVASC) 10 MG tablet Take 1 tablet (10 mg total) by mouth daily.   busPIRone (BUSPAR) 5 MG tablet Take 1-2 tablets (5-10 mg total) by mouth 3 (three) times daily.   ezetimibe (ZETIA) 10 MG tablet TAKE 1 TABLET BY MOUTH EVERY DAY   latanoprost (XALATAN) 0.005 % ophthalmic solution Place 1 drop into both eyes at bedtime.   Multiple Vitamin (MULTIVITAMIN) tablet Take 1 tablet by mouth daily. chewable   omeprazole (PRILOSEC) 20 MG capsule TAKE 1 CAPSULE BY MOUTH EVERY DAY   pravastatin (PRAVACHOL) 40 MG tablet TAKE 1 TABLET BY MOUTH EVERY DAY   [DISCONTINUED] DULoxetine (CYMBALTA) 20 MG capsule TAKE 1 CAPSULE BY MOUTH EVERY DAY   [DISCONTINUED] phentermine (ADIPEX-P) 37.5 MG tablet Take 1 tablet (37.5 mg total) by mouth daily before breakfast.   phentermine (ADIPEX-P) 37.5 MG tablet Take 1 tablet (37.5 mg total) by mouth daily before breakfast.   No facility-administered encounter medications on file as of 02/28/2023.    Surgical History: Past Surgical History:  Procedure Laterality Date   BREAST CYST ASPIRATION Left yrs ago   LUMBAR LAMINECTOMY/DECOMPRESSION MICRODISCECTOMY Left 06/18/2018   Procedure: LUMBAR LAMINECTOMY/DECOMPRESSION MICRODISCECTOMY  1 LEVEL, L5-S1  LEFT;  Surgeon: Venetia Night, MD;  Location: [facility] ORS;  Service: Neurosurgery;  Laterality: Left;   STENT PLACE LEFT URETER ([facility] HX)     unsure  of side   TONSILLECTOMY     tubiligation      Medical History: Past Medical History:  Diagnosis Date   Anxiety    Back pain    Bulging lumbar disc    Chest pain    Depression    GERD (gastroesophageal reflux disease)    History of kidney stones    Hypertension    Migraine    Sleep apnea    has never used a cpap machine   Varicose veins of legs    Vertigo    Vision problems     Family History: Family History  Problem Relation Age of Onset   Hypertension Mother    Cancer Father    Hypertension Father    Hyperlipidemia Father    Breast cancer Neg Hx     Social History   Socioeconomic History   Marital status: Married    Spouse name: Trey Paula   Number of children: Not on file   Years of education: Not on file   Highest education level: Not on file  Occupational History   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    Comment: [employer]  Tobacco Use   Smoking status: Former    Current packs/day: 0.00    Types: Cigarettes    Quit date: 2000    Years since quitting: 24.9  Smokeless tobacco: Never  Vaping Use   Vaping status: Never Used  Substance and Sexual Activity   Alcohol use: Yes    Comment: socially   Drug use: No   Sexual activity: Not Currently  Other Topics Concern   Not on file  Social History Narrative   Not on file   Social Determinants of Health   Financial Resource Strain: Not on file  Food Insecurity: Not on file  Transportation Needs: Not on file  Physical Activity: Not on file  Stress: Not on file  Social Connections: Not on file  Intimate Partner Violence: Not on file      Review of Systems  Constitutional:  Positive for appetite change and unexpected weight change.  HENT:  Negative for postnasal drip, rhinorrhea, sinus pressure, sinus pain and sneezing.   Respiratory:   Positive for cough (off and on). Negative for chest tightness, shortness of breath and wheezing.   Cardiovascular: Negative.  Negative for chest pain and palpitations.  Gastrointestinal: Negative.   Neurological:  Negative for dizziness, light-headedness and headaches.    Vital Signs: BP 126/78   Pulse 67   Temp 98.3 F (36.8 C)   Resp 16   Ht 5\' 3"  (1.6 m)   Wt 178 lb (80.7 kg)   SpO2 95%   BMI 31.53 kg/m    Physical Exam Vitals reviewed.  Constitutional:      General: She is not in acute distress.    Appearance: Normal appearance. She is obese. She is not ill-appearing.  HENT:     Head: Normocephalic and atraumatic.  Eyes:     Pupils: Pupils are equal, round, and reactive to light.  Cardiovascular:     Rate and Rhythm: Normal rate and regular rhythm.  Pulmonary:     Effort: Pulmonary effort is normal. No respiratory distress.  Neurological:     Mental Status: She is alert and oriented to person, place, and time.  Psychiatric:        Mood and Affect: Mood normal.        Behavior: Behavior normal.        Assessment/Plan: 1. Post-nasal drip Possibly causing her chronic cough issue, discussed trying OTC allergy medications such as zyrtec or claritin.   2. Abnormal weight gain Try 1 more month of phentermine, follow up in 4 weeks  - phentermine (ADIPEX-P) 37.5 MG tablet; Take 1 tablet (37.5 mg total) by mouth daily before breakfast.  Dispense: 30 tablet; Refill: 0  3. Class 1 obesity due to excess calories with serious comorbidity and body mass index (BMI) of 31.0 to 31.9 in adult Try 1 more month of phentermine, follow up in 4 weeks  - phentermine (ADIPEX-P) 37.5 MG tablet; Take 1 tablet (37.5 mg total) by mouth daily before breakfast.  Dispense: 30 tablet; Refill: 0  4. GAD (generalized anxiety disorder) Start buspirone as prescribed. Discontinue duloxetine  - busPIRone (BUSPAR) 5 MG tablet; Take 1-2 tablets (5-10 mg total) by mouth 3 (three) times daily.   Dispense: 180 tablet; Refill: 0   General Counseling: Patricia Frazier verbalizes understanding of the findings of todays visit and agrees with plan of treatment. I have discussed any further diagnostic evaluation that may be needed or ordered today. We also reviewed her medications today. she has been encouraged to call the office with any questions or concerns that should arise related to todays visit.    No orders of the defined types were placed in this encounter.   Meds ordered this encounter  Medications   phentermine (ADIPEX-P) 37.5 MG tablet    Sig: Take 1 tablet (37.5 mg total) by mouth daily before breakfast.    Dispense:  30 tablet    Refill:  0    Fill today   busPIRone (BUSPAR) 5 MG tablet    Sig: Take 1-2 tablets (5-10 mg total) by mouth 3 (three) times daily.    Dispense:  180 tablet    Refill:  0    Fill new script today, discontinue duloxetine    Return in about 4 weeks (around 03/28/2023) for F/U, eval new med, Weight loss, Anasia Agro PCP.   Total time spent:30 Minutes Time spent includes review of chart, medications, test results, and follow up plan with the patient.   [place] Controlled Substance Database was reviewed by me.  This patient was seen by Sallyanne Kuster, FNP-C in collaboration with Dr. Beverely Risen as a part of collaborative care agreement.   Mackensie Pilson R. Tedd Sias, MSN, FNP-C Internal medicine

## 2023-03-01 DIAGNOSIS — M47816 Spondylosis without myelopathy or radiculopathy, lumbar region: Secondary | ICD-10-CM | POA: Diagnosis not present

## 2023-03-14 ENCOUNTER — Other Ambulatory Visit: Payer: Self-pay | Admitting: Internal Medicine

## 2023-03-14 ENCOUNTER — Other Ambulatory Visit: Payer: Self-pay | Admitting: Nurse Practitioner

## 2023-03-14 DIAGNOSIS — M5416 Radiculopathy, lumbar region: Secondary | ICD-10-CM | POA: Diagnosis not present

## 2023-03-14 DIAGNOSIS — M47816 Spondylosis without myelopathy or radiculopathy, lumbar region: Secondary | ICD-10-CM | POA: Diagnosis not present

## 2023-03-14 DIAGNOSIS — F411 Generalized anxiety disorder: Secondary | ICD-10-CM

## 2023-03-14 DIAGNOSIS — M5442 Lumbago with sciatica, left side: Secondary | ICD-10-CM | POA: Diagnosis not present

## 2023-03-14 DIAGNOSIS — M5441 Lumbago with sciatica, right side: Secondary | ICD-10-CM | POA: Diagnosis not present

## 2023-03-14 DIAGNOSIS — G8929 Other chronic pain: Secondary | ICD-10-CM | POA: Diagnosis not present

## 2023-03-14 DIAGNOSIS — I10 Essential (primary) hypertension: Secondary | ICD-10-CM

## 2023-03-23 ENCOUNTER — Other Ambulatory Visit: Payer: Self-pay | Admitting: Nurse Practitioner

## 2023-03-23 DIAGNOSIS — F411 Generalized anxiety disorder: Secondary | ICD-10-CM

## 2023-04-08 ENCOUNTER — Ambulatory Visit: Payer: Medicare HMO | Admitting: Nurse Practitioner

## 2023-04-15 DIAGNOSIS — H2513 Age-related nuclear cataract, bilateral: Secondary | ICD-10-CM | POA: Diagnosis not present

## 2023-04-15 DIAGNOSIS — H401231 Low-tension glaucoma, bilateral, mild stage: Secondary | ICD-10-CM | POA: Diagnosis not present

## 2023-04-15 DIAGNOSIS — H43813 Vitreous degeneration, bilateral: Secondary | ICD-10-CM | POA: Diagnosis not present

## 2023-04-18 DIAGNOSIS — M47816 Spondylosis without myelopathy or radiculopathy, lumbar region: Secondary | ICD-10-CM | POA: Diagnosis not present

## 2023-04-22 ENCOUNTER — Encounter: Payer: Self-pay | Admitting: Nurse Practitioner

## 2023-04-22 ENCOUNTER — Ambulatory Visit (INDEPENDENT_AMBULATORY_CARE_PROVIDER_SITE_OTHER): Payer: Self-pay | Admitting: Nurse Practitioner

## 2023-04-22 VITALS — BP 120/78 | HR 70 | Temp 98.3°F | Resp 16 | Ht 63.0 in | Wt 180.0 lb

## 2023-04-22 DIAGNOSIS — E6609 Other obesity due to excess calories: Secondary | ICD-10-CM

## 2023-04-22 DIAGNOSIS — E66811 Obesity, class 1: Secondary | ICD-10-CM | POA: Diagnosis not present

## 2023-04-22 DIAGNOSIS — M5416 Radiculopathy, lumbar region: Secondary | ICD-10-CM

## 2023-04-22 DIAGNOSIS — F411 Generalized anxiety disorder: Secondary | ICD-10-CM | POA: Diagnosis not present

## 2023-04-22 DIAGNOSIS — I10 Essential (primary) hypertension: Secondary | ICD-10-CM | POA: Diagnosis not present

## 2023-04-22 DIAGNOSIS — Z6831 Body mass index (BMI) 31.0-31.9, adult: Secondary | ICD-10-CM | POA: Diagnosis not present

## 2023-04-22 NOTE — Progress Notes (Signed)
 [facility] [address] [place], [place] [address]  Internal MEDICINE  Office Visit Note  Patient Name: Patricia Frazier  [id number]  [id number]  Date of Service: 04/22/2023  Chief Complaint  Patient presents with   Depression   Gastroesophageal Reflux   Hypertension   Follow-up    HPI Patricia Frazier presents for a follow-up visit for weight loss, back pain, and anxiety Weight loss -- gained 2 more lbs since last visit, phentermine  is not helping as was hoped for. Tends to eat cheese and crackers late at night.  Back pain -- has not been able to get out and walk much due to back pain. Recently had a surgical procedure on her lower back.  Anxiety -- this is manageable. No significant improvement with the buspirone  added to it.     Current Medication: Outpatient Encounter Medications as of 04/22/2023  Medication Sig   amLODipine  (NORVASC ) 10 MG tablet TAKE 1 TABLET BY MOUTH EVERY DAY   ezetimibe  (ZETIA ) 10 MG tablet TAKE 1 TABLET BY MOUTH EVERY DAY   latanoprost (XALATAN) 0.005 % ophthalmic solution Place 1 drop into both eyes at bedtime.   Multiple Vitamin (MULTIVITAMIN) tablet Take 1 tablet by mouth daily. chewable   omeprazole  (PRILOSEC ) 20 MG capsule TAKE 1 CAPSULE BY MOUTH EVERY DAY   pravastatin  (PRAVACHOL ) 40 MG tablet TAKE 1 TABLET BY MOUTH EVERY DAY   [DISCONTINUED] busPIRone  (BUSPAR ) 5 MG tablet Take 1-2 tablets (5-10 mg total) by mouth 3 (three) times daily.   [DISCONTINUED] phentermine  (ADIPEX-P ) 37.5 MG tablet Take 1 tablet (37.5 mg total) by mouth daily before breakfast.   No facility-administered encounter medications on file as of 04/22/2023.    Surgical History: Past Surgical History:  Procedure Laterality Date   BREAST CYST ASPIRATION Left yrs ago   LUMBAR LAMINECTOMY/DECOMPRESSION MICRODISCECTOMY Left 06/18/2018   Procedure: LUMBAR LAMINECTOMY/DECOMPRESSION MICRODISCECTOMY 1 LEVEL, L5-S1  LEFT;  Surgeon: Clois Fret, MD;  Location: [facility] ORS;   Service: Neurosurgery;  Laterality: Left;   STENT PLACE LEFT URETER ([facility] HX)     unsure  of side   TONSILLECTOMY     tubiligation      Medical History: Past Medical History:  Diagnosis Date   Anxiety    Back pain    Bulging lumbar disc    Chest pain    Depression    GERD (gastroesophageal reflux disease)    History of kidney stones    Hypertension    Migraine    Sleep apnea    has never used a cpap machine   Varicose veins of legs    Vertigo    Vision problems     Family History: Family History  Problem Relation Age of Onset   Hypertension Mother    Cancer Father    Hypertension Father    Hyperlipidemia Father    Breast cancer Neg Hx     Social History   Socioeconomic History   Marital status: Married    Spouse name: Chyrl   Number of children: Not on file   Years of education: Not on file   Highest education level: Not on file  Occupational History   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    Comment: [employer]  Tobacco Use   Smoking status: Former    Current packs/day: 0.00    Types: Cigarettes    Quit date: 2000    Years since quitting: 25.0   Smokeless tobacco: Never  Vaping Use   Vaping status: Never Used  Substance and Sexual Activity   Alcohol use: Yes    Comment: socially   Drug use: No   Sexual activity: Not Currently  Other Topics Concern   Not on file  Social History Narrative   Not on file   Social Drivers of Health   Financial Resource Strain: Not on file  Food Insecurity: Not on file  Transportation Needs: Not on file  Physical Activity: Not on file  Stress: Not on file  Social Connections: Not on file  Intimate Partner Violence: Not on file      Review of Systems  Constitutional:  Positive for appetite change, fatigue and unexpected weight change.  HENT:  Negative for postnasal drip, rhinorrhea, sinus pressure, sinus pain and sneezing.   Respiratory: Negative.  Negative for cough, chest tightness, shortness of breath and  wheezing.   Cardiovascular: Negative.  Negative for chest pain and palpitations.  Gastrointestinal: Negative.   Musculoskeletal:  Positive for arthralgias and back pain.  Neurological:  Negative for dizziness, light-headedness and headaches.    Vital Signs: BP 120/78   Pulse 70   Temp 98.3 F (36.8 C)   Resp 16   Ht 5' 3 (1.6 m)   Wt 180 lb (81.6 kg)   SpO2 98%   BMI 31.89 kg/m    Physical Exam Vitals reviewed.  Constitutional:      General: She is not in acute distress.    Appearance: Normal appearance. She is obese. She is not ill-appearing.  HENT:     Head: Normocephalic and atraumatic.  Eyes:     Pupils: Pupils are equal, round, and reactive to light.  Cardiovascular:     Rate and Rhythm: Normal rate and regular rhythm.  Neurological:     Mental Status: She is alert and oriented to person, place, and time.  Psychiatric:        Mood and Affect: Mood normal.        Behavior: Behavior normal.        Assessment/Plan: 1. Essential (primary) hypertension (Primary) Stable, continue amlodipine  as prescribed.   2. Lumbar radiculopathy Recent ablation of the lumbar spine for back pain, in recovery process now.   3. Class 1 obesity due to excess calories with serious comorbidity and body mass index (BMI) of 31.0 to 31.9 in adult As she recovers, plans to start walking daily to help with weight loss and decreasing late night snacking.   4. GAD (generalized anxiety disorder) Continue duloxetine  as prescribed. Discontinue buspirone .    General Counseling: Patricia Frazier understanding of the findings of todays visit and agrees with plan of treatment. I have discussed any further diagnostic evaluation that may be needed or ordered today. We also reviewed her medications today. she has been encouraged to call the office with any questions or concerns that should arise related to todays visit.    No orders of the defined types were placed in this encounter.   No  orders of the defined types were placed in this encounter.   Return for previously scheduled, AWV, Patricia Frazier PCP in april.   Total time spent:30 Minutes Time spent includes review of chart, medications, test results, and follow up plan with the patient.   [place] Controlled Substance Database was reviewed by me.  This patient was seen by Mardy Maxin, FNP-C in collaboration with Dr. Sigrid Bathe as a part of collaborative care agreement.   Freyja Govea R. Maxin, MSN, FNP-C Internal medicine

## 2023-05-21 ENCOUNTER — Other Ambulatory Visit: Payer: Self-pay | Admitting: Nurse Practitioner

## 2023-05-21 DIAGNOSIS — E782 Mixed hyperlipidemia: Secondary | ICD-10-CM

## 2023-05-26 ENCOUNTER — Other Ambulatory Visit: Payer: Self-pay | Admitting: Nurse Practitioner

## 2023-06-03 DIAGNOSIS — M47816 Spondylosis without myelopathy or radiculopathy, lumbar region: Secondary | ICD-10-CM | POA: Diagnosis not present

## 2023-06-03 DIAGNOSIS — M5442 Lumbago with sciatica, left side: Secondary | ICD-10-CM | POA: Diagnosis not present

## 2023-06-03 DIAGNOSIS — M5441 Lumbago with sciatica, right side: Secondary | ICD-10-CM | POA: Diagnosis not present

## 2023-06-03 DIAGNOSIS — M5416 Radiculopathy, lumbar region: Secondary | ICD-10-CM | POA: Diagnosis not present

## 2023-06-03 DIAGNOSIS — G8929 Other chronic pain: Secondary | ICD-10-CM | POA: Diagnosis not present

## 2023-07-01 ENCOUNTER — Telehealth (INDEPENDENT_AMBULATORY_CARE_PROVIDER_SITE_OTHER): Admitting: Physician Assistant

## 2023-07-01 ENCOUNTER — Encounter: Payer: Self-pay | Admitting: Physician Assistant

## 2023-07-01 VITALS — Resp 16 | Ht 63.0 in

## 2023-07-01 DIAGNOSIS — U071 COVID-19: Secondary | ICD-10-CM

## 2023-07-01 MED ORDER — AZITHROMYCIN 250 MG PO TABS
ORAL_TABLET | ORAL | 0 refills | Status: AC
Start: 2023-07-01 — End: 2023-07-06

## 2023-07-01 NOTE — Progress Notes (Signed)
 [facility] [address] [place], [place] [address]  Internal MEDICINE  Telephone Visit  Patient Name: Patricia Frazier  [id number]  [id number]  Date of Service: 07/01/2023  I connected with the patient at 11:50 by telephone and verified the patients identity using two identifiers.   I discussed the limitations, risks, security and privacy concerns of performing an evaluation and management service by telephone and the availability of in person appointments. I also discussed with the patient that there may be a patient responsible charge related to the service.  The patient expressed understanding and agrees to proceed.    Chief Complaint  Patient presents with   Telephone Assessment    [phone], telephone call   Telephone Screen   Covid Positive    HPI Pt is here for virtual sick visit -Last Sunday covid Positive -Mon and Tuesday had fever and broke Wednesday -Coughing, some wheezing but clears with cough. Chest a little tight, but not getting SOB -Fatigued -Drinking water, appetite reduced since being sick. Some diarrhea but this has stopped.  -taking advil, [facility] day and night cold and flu. -not getting worse just not getting better -states this is her first time with covid and is unsure how long it lasts. Discussed fatigue and cough can sometimes last beyond the acute period  Current Medication: Outpatient Encounter Medications as of 07/01/2023  Medication Sig   amLODipine (NORVASC) 10 MG tablet TAKE 1 TABLET BY MOUTH EVERY DAY   azithromycin (ZITHROMAX) 250 MG tablet Take 2 tablets on day 1, then 1 tablet daily on days 2 through 5   ezetimibe (ZETIA) 10 MG tablet TAKE 1 TABLET BY MOUTH EVERY DAY   latanoprost (XALATAN) 0.005 % ophthalmic solution Place 1 drop into both eyes at bedtime.   Multiple Vitamin (MULTIVITAMIN) tablet Take 1 tablet by mouth daily. chewable   omeprazole (PRILOSEC) 20 MG capsule TAKE 1 CAPSULE BY MOUTH EVERY DAY   pravastatin  (PRAVACHOL) 40 MG tablet TAKE 1 TABLET BY MOUTH EVERY DAY   No facility-administered encounter medications on file as of 07/01/2023.    Surgical History: Past Surgical History:  Procedure Laterality Date   BREAST CYST ASPIRATION Left yrs ago   LUMBAR LAMINECTOMY/DECOMPRESSION MICRODISCECTOMY Left 06/18/2018   Procedure: LUMBAR LAMINECTOMY/DECOMPRESSION MICRODISCECTOMY 1 LEVEL, L5-S1  LEFT;  Surgeon: Venetia Night, MD;  Location: [facility] ORS;  Service: Neurosurgery;  Laterality: Left;   STENT PLACE LEFT URETER ([facility] HX)     unsure  of side   TONSILLECTOMY     tubiligation      Medical History: Past Medical History:  Diagnosis Date   Anxiety    Back pain    Bulging lumbar disc    Chest pain    Depression    GERD (gastroesophageal reflux disease)    History of kidney stones    Hypertension    Migraine    Sleep apnea    has never used a cpap machine   Varicose veins of legs    Vertigo    Vision problems     Family History: Family History  Problem Relation Age of Onset   Hypertension Mother    Cancer Father    Hypertension Father    Hyperlipidemia Father    Breast cancer Neg Hx     Social History   Socioeconomic History   Marital status: Married    Spouse name: Trey Paula   Number of children: Not on file   Years of education: Not on file   Highest  education level: Not on file  Occupational History   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    Comment: [employer]  Tobacco Use   Smoking status: Former    Current packs/day: 0.00    Types: Cigarettes    Quit date: 2000    Years since quitting: 25.2   Smokeless tobacco: Never  Vaping Use   Vaping status: Never Used  Substance and Sexual Activity   Alcohol use: Yes    Comment: socially   Drug use: No   Sexual activity: Not Currently  Other Topics Concern   Not on file  Social History Narrative   Not on file   Social Drivers of Health   Financial Resource Strain: Not on file  Food Insecurity: Not on file   Transportation Needs: Not on file  Physical Activity: Not on file  Stress: Not on file  Social Connections: Not on file  Intimate Partner Violence: Not on file      Review of Systems  Constitutional:  Positive for appetite change and fatigue.  HENT:  Positive for congestion and postnasal drip. Negative for mouth sores.   Respiratory:  Positive for cough, chest tightness and wheezing. Negative for shortness of breath.   Cardiovascular:  Negative for chest pain.  Gastrointestinal:  Negative for nausea.  Genitourinary:  Negative for flank pain.  Skin:  Negative for rash.  Psychiatric/Behavioral: Negative.      Vital Signs: Resp 16   Ht 5\' 3"  (1.6 m)   BMI 31.89 kg/m    Observation/Objective:  Pt is able to carry out conversation   Assessment/Plan: 1. COVID-19 (Primary) Covid+ 1 week ago with continued cough/congestion and fatigue. Will start zpak and may use cough medication prn. Advised that cough and fatigue can linger for awhile, but hopefully will start to improve soon. Let office know if any new or worsening symptoms. - azithromycin (ZITHROMAX) 250 MG tablet; Take 2 tablets on day 1, then 1 tablet daily on days 2 through 5  Dispense: 6 tablet; Refill: 0   General Counseling: Kursten verbalizes understanding of the findings of today's phone visit and agrees with plan of treatment. I have discussed any further diagnostic evaluation that may be needed or ordered today. We also reviewed her medications today. she has been encouraged to call the office with any questions or concerns that should arise related to todays visit.    No orders of the defined types were placed in this encounter.   Meds ordered this encounter  Medications   azithromycin (ZITHROMAX) 250 MG tablet    Sig: Take 2 tablets on day 1, then 1 tablet daily on days 2 through 5    Dispense:  6 tablet    Refill:  0    Time spent:25 Minutes    Dr Lyndon Code Internal medicine

## 2023-07-15 ENCOUNTER — Ambulatory Visit: Payer: PPO | Admitting: Nurse Practitioner

## 2023-07-30 ENCOUNTER — Ambulatory Visit: Admitting: Nurse Practitioner

## 2023-08-05 ENCOUNTER — Ambulatory Visit (INDEPENDENT_AMBULATORY_CARE_PROVIDER_SITE_OTHER): Admitting: Nurse Practitioner

## 2023-08-05 ENCOUNTER — Encounter: Payer: Self-pay | Admitting: Nurse Practitioner

## 2023-08-05 VITALS — BP 124/86 | HR 76 | Temp 98.6°F | Resp 16 | Ht 63.0 in | Wt 185.2 lb

## 2023-08-05 DIAGNOSIS — E782 Mixed hyperlipidemia: Secondary | ICD-10-CM | POA: Diagnosis not present

## 2023-08-05 DIAGNOSIS — M5416 Radiculopathy, lumbar region: Secondary | ICD-10-CM

## 2023-08-05 DIAGNOSIS — Z0000 Encounter for general adult medical examination without abnormal findings: Secondary | ICD-10-CM

## 2023-08-05 DIAGNOSIS — I10 Essential (primary) hypertension: Secondary | ICD-10-CM

## 2023-08-05 MED ORDER — EZETIMIBE 10 MG PO TABS
10.0000 mg | ORAL_TABLET | Freq: Every day | ORAL | 3 refills | Status: DC
Start: 2023-08-05 — End: 2023-11-27

## 2023-08-05 NOTE — Progress Notes (Signed)
 [facility] [address] [place], [place] [address]  Internal MEDICINE  Office Visit Note  Patient Name: Patricia Frazier  [id number]  [id number]  Date of Service: 08/05/2023  Chief Complaint  Patient presents with   Depression   Gastroesophageal Reflux   Hypertension   Medicare Wellness    HPI Patricia Frazier presents for an annual well visit and physical exam.  Well-appearing 73 y.o. female with GERD, hypertension, high cholesterol and anxiety  Routine CRC screening: due in 2027  Routine mammogram: due in July this year  DEXA scan: done last year  Labs: due for labs in July  New or worsening pain: none  Other concerns: weight gain. Cannot take phentermine  anymore, gained 5 more lbs. Reprots that programs like atkins and weight watchers has worked for her in the past      08/05/2023    2:27 PM 07/11/2022   10:42 AM 05/18/2021   11:13 AM  MMSE - Mini Mental State Exam  Orientation to time 5 0 5  Orientation to Place 5 5 5   Registration 3 3 3   Attention/ Calculation 5 5 5   Recall 3 3 3   Language- name 2 objects 2 2 2   Language- repeat 1 1 1   Language- follow 3 step command 3 3 3   Language- read & follow direction 1 1 1   Write a sentence 0 1 1  Copy design 1 1 1   Total score 29 25 30     Functional Status Survey: Is the patient deaf or have difficulty hearing?: No Does the patient have difficulty seeing, even when wearing glasses/contacts?: No Does the patient have difficulty concentrating, remembering, or making decisions?: No Does the patient have difficulty walking or climbing stairs?: No Does the patient have difficulty dressing or bathing?: No Does the patient have difficulty doing errands alone such as visiting a doctor's office or shopping?: No     05/18/2021   11:11 AM 06/29/2021    3:44 PM 11/21/2021    9:46 AM 07/11/2022   10:41 AM 08/05/2023    2:26 PM  Fall Risk  Falls in the past year? 0 1 0 1 1  Was there an injury with Fall?  0  1 0  Fall Risk  Category Calculator  1  2 1   Fall Risk Category (Retired)  Low     (RETIRED) Patient Fall Risk Level Low fall risk Low fall risk     Patient at Risk for Falls Due to No Fall Risks      Fall risk Follow up Falls evaluation completed Falls evaluation completed  Falls evaluation completed Falls evaluation completed       08/05/2023    2:43 PM  Depression screen PHQ 2/9  Decreased Interest 0  Down, Depressed, Hopeless 0  PHQ - 2 Score 0        Current Medication: Outpatient Encounter Medications as of 08/05/2023  Medication Sig   amLODipine  (NORVASC ) 10 MG tablet TAKE 1 TABLET BY MOUTH EVERY DAY   latanoprost (XALATAN) 0.005 % ophthalmic solution Place 1 drop into both eyes at bedtime.   Multiple Vitamin (MULTIVITAMIN) tablet Take 1 tablet by mouth daily. chewable   omeprazole  (PRILOSEC ) 20 MG capsule TAKE 1 CAPSULE BY MOUTH EVERY DAY   pravastatin  (PRAVACHOL ) 40 MG tablet TAKE 1 TABLET BY MOUTH EVERY DAY   [DISCONTINUED] ezetimibe  (ZETIA ) 10 MG tablet TAKE 1 TABLET BY MOUTH EVERY DAY   ezetimibe  (ZETIA ) 10 MG tablet Take 1 tablet (10 mg total) by  mouth daily.   No facility-administered encounter medications on file as of 08/05/2023.    Surgical History: Past Surgical History:  Procedure Laterality Date   BREAST CYST ASPIRATION Left yrs ago   LUMBAR LAMINECTOMY/DECOMPRESSION MICRODISCECTOMY Left 06/18/2018   Procedure: LUMBAR LAMINECTOMY/DECOMPRESSION MICRODISCECTOMY 1 LEVEL, L5-S1  LEFT;  Surgeon: Jodeen Munch, MD;  Location: [facility] ORS;  Service: Neurosurgery;  Laterality: Left;   STENT PLACE LEFT URETER ([facility] HX)     unsure  of side   TONSILLECTOMY     tubiligation      Medical History: Past Medical History:  Diagnosis Date   Anxiety    Back pain    Bulging lumbar disc    Chest pain    Depression    GERD (gastroesophageal reflux disease)    History of kidney stones    Hypertension    Migraine    Sleep apnea    has never used a cpap machine   Varicose veins  of legs    Vertigo    Vision problems     Family History: Family History  Problem Relation Age of Onset   Hypertension Mother    Cancer Father    Hypertension Father    Hyperlipidemia Father    Breast cancer Neg Hx     Social History   Socioeconomic History   Marital status: Married    Spouse name: Patricia Frazier   Number of children: Not on file   Years of education: Not on file   Highest education level: Not on file  Occupational History   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    Comment: [employer]  Tobacco Use   Smoking status: Former    Current packs/day: 0.00    Types: Cigarettes    Quit date: 2000    Years since quitting: 25.3   Smokeless tobacco: Never  Vaping Use   Vaping status: Never Used  Substance and Sexual Activity   Alcohol use: Yes    Comment: socially   Drug use: No   Sexual activity: Not Currently  Other Topics Concern   Not on file  Social History Narrative   Not on file   Social Drivers of Health   Financial Resource Strain: Not on file  Food Insecurity: Not on file  Transportation Needs: Not on file  Physical Activity: Not on file  Stress: Not on file  Social Connections: Not on file  Intimate Partner Violence: Not on file      Review of Systems  Constitutional:  Negative for activity change, appetite change, chills, fatigue, fever and unexpected weight change.  HENT: Negative.  Negative for congestion, ear pain, rhinorrhea, sore throat and trouble swallowing.   Eyes: Negative.   Respiratory: Negative.  Negative for cough, chest tightness, shortness of breath and wheezing.   Cardiovascular: Negative.  Negative for chest pain and palpitations.  Gastrointestinal: Negative.  Negative for abdominal pain, blood in stool, constipation, diarrhea, nausea and vomiting.  Endocrine: Negative.   Genitourinary: Negative.  Negative for difficulty urinating, dysuria, frequency, hematuria and urgency.  Musculoskeletal: Negative.  Negative for arthralgias,  back pain, joint swelling, myalgias and neck pain.  Skin: Negative.  Negative for rash and wound.  Allergic/Immunologic: Negative.  Negative for immunocompromised state.  Neurological: Negative.  Negative for dizziness, seizures, numbness and headaches.  Hematological: Negative.   Psychiatric/Behavioral:  Negative for behavioral problems, self-injury and suicidal ideas. The patient is not nervous/anxious.     Vital Signs: BP 124/86   Pulse 76   Temp  98.6 F (37 C)   Resp 16   Ht 5\' 3"  (1.6 m)   Wt 185 lb 3.2 oz (84 kg)   SpO2 97%   BMI 32.81 kg/m    Physical Exam Vitals reviewed.  Constitutional:      General: She is awake. She is not in acute distress.    Appearance: Normal appearance. She is well-developed and well-groomed. She is obese. She is not ill-appearing or diaphoretic.  HENT:     Head: Normocephalic and atraumatic.     Right Ear: Tympanic membrane, ear canal and external ear normal.     Left Ear: Tympanic membrane, ear canal and external ear normal.     Nose: Nose normal. No congestion or rhinorrhea.     Mouth/Throat:     Lips: Pink.     Mouth: Mucous membranes are moist.     Pharynx: Oropharynx is clear. Uvula midline. No oropharyngeal exudate or posterior oropharyngeal erythema.  Eyes:     General: Lids are normal. Vision grossly intact. Gaze aligned appropriately. No scleral icterus.       Right eye: No discharge.        Left eye: No discharge.     Extraocular Movements: Extraocular movements intact.     Conjunctiva/sclera: Conjunctivae normal.     Pupils: Pupils are equal, round, and reactive to light.     Funduscopic exam:    Right eye: Red reflex present.        Left eye: Red reflex present. Neck:     Thyroid : No thyromegaly.     Vascular: No JVD.     Trachea: Trachea and phonation normal. No tracheal deviation.  Cardiovascular:     Rate and Rhythm: Normal rate and regular rhythm.     Pulses: Normal pulses.     Heart sounds: Normal heart sounds, S1  normal and S2 normal. No murmur heard.    No friction rub. No gallop.  Pulmonary:     Effort: Pulmonary effort is normal. No accessory muscle usage or respiratory distress.     Breath sounds: Normal breath sounds. No stridor. No wheezing or rales.  Chest:     Chest wall: No tenderness.     Comments: Declined clinical breast exam, patient gets annual mammograms.  Abdominal:     General: Bowel sounds are normal. There is no distension.     Palpations: Abdomen is soft. There is no shifting dullness, fluid wave, mass or pulsatile mass.     Tenderness: There is no abdominal tenderness. There is no guarding or rebound.  Musculoskeletal:        General: No tenderness or deformity. Normal range of motion.     Cervical back: Normal range of motion and neck supple.     Right lower leg: No edema.     Left lower leg: No edema.  Lymphadenopathy:     Cervical: No cervical adenopathy.  Skin:    General: Skin is warm and dry.     Capillary Refill: Capillary refill takes less than 2 seconds.     Coloration: Skin is not pale.     Findings: No erythema or rash.  Neurological:     Mental Status: She is alert and oriented to person, place, and time.     Cranial Nerves: No cranial nerve deficit.     Motor: No abnormal muscle tone.     Coordination: Coordination normal.     Gait: Gait normal.     Deep Tendon Reflexes: Reflexes are normal  and symmetric.  Psychiatric:        Mood and Affect: Mood normal.        Behavior: Behavior normal. Behavior is cooperative.        Thought Content: Thought content normal.        Judgment: Judgment normal.        Assessment/Plan: 1. Encounter for subsequent annual wellness visit (AWV) in Medicare patient (Primary) Age-appropriate preventive screenings and vaccinations discussed. Routine labs for health maintenance will be ordered. PHM updated.    2. Essential (primary) hypertension Stable, continue amlodipine  as prescribed.   3. Mixed  hyperlipidemia Continue pravastatin  and zetia  as prescribed.  - ezetimibe  (ZETIA ) 10 MG tablet; Take 1 tablet (10 mg total) by mouth daily.  Dispense: 90 tablet; Refill: 3  4. Lumbar radiculopathy Continue OTC medications such as tylenol  and ibuprofen as needed.      General Counseling: Patricia Frazier understanding of the findings of todays visit and agrees with plan of treatment. I have discussed any further diagnostic evaluation that may be needed or ordered today. We also reviewed her medications today. she has been encouraged to call the office with any questions or concerns that should arise related to todays visit.    No orders of the defined types were placed in this encounter.   Meds ordered this encounter  Medications   ezetimibe  (ZETIA ) 10 MG tablet    Sig: Take 1 tablet (10 mg total) by mouth daily.    Dispense:  90 tablet    Refill:  3    For future refills    Return in about 6 months (around 02/04/2024) for F/U, Patricia Frazier PCP, labs .   Total time spent:30 Minutes Time spent includes review of chart, medications, test results, and follow up plan with the patient.   [place] Controlled Substance Database was reviewed by me.  This patient was seen by Laurence Pons, FNP-C in collaboration with Dr. Verneta Gone as a part of collaborative care agreement.  Imagene Boss R. Bobbi Burow, MSN, FNP-C Internal medicine

## 2023-08-06 ENCOUNTER — Encounter: Payer: Self-pay | Admitting: Nurse Practitioner

## 2023-09-23 DIAGNOSIS — H43813 Vitreous degeneration, bilateral: Secondary | ICD-10-CM | POA: Diagnosis not present

## 2023-09-23 DIAGNOSIS — H2513 Age-related nuclear cataract, bilateral: Secondary | ICD-10-CM | POA: Diagnosis not present

## 2023-09-23 DIAGNOSIS — H524 Presbyopia: Secondary | ICD-10-CM | POA: Diagnosis not present

## 2023-09-23 DIAGNOSIS — H5213 Myopia, bilateral: Secondary | ICD-10-CM | POA: Diagnosis not present

## 2023-09-23 DIAGNOSIS — H401231 Low-tension glaucoma, bilateral, mild stage: Secondary | ICD-10-CM | POA: Diagnosis not present

## 2023-10-21 ENCOUNTER — Other Ambulatory Visit: Payer: Self-pay | Admitting: Nurse Practitioner

## 2023-10-21 DIAGNOSIS — E782 Mixed hyperlipidemia: Secondary | ICD-10-CM | POA: Diagnosis not present

## 2023-10-21 DIAGNOSIS — I10 Essential (primary) hypertension: Secondary | ICD-10-CM | POA: Diagnosis not present

## 2023-10-21 DIAGNOSIS — I341 Nonrheumatic mitral (valve) prolapse: Secondary | ICD-10-CM | POA: Diagnosis not present

## 2023-10-21 DIAGNOSIS — E559 Vitamin D deficiency, unspecified: Secondary | ICD-10-CM | POA: Diagnosis not present

## 2023-10-22 ENCOUNTER — Ambulatory Visit: Payer: Self-pay | Admitting: Nurse Practitioner

## 2023-10-22 LAB — CBC WITH DIFFERENTIAL/PLATELET
Basophils Absolute: 0 x10E3/uL (ref 0.0–0.2)
Basos: 0 %
EOS (ABSOLUTE): 0.1 x10E3/uL (ref 0.0–0.4)
Eos: 2 %
Hematocrit: 40.8 % (ref 34.0–46.6)
Hemoglobin: 13.5 g/dL (ref 11.1–15.9)
Immature Grans (Abs): 0 x10E3/uL (ref 0.0–0.1)
Immature Granulocytes: 0 %
Lymphocytes Absolute: 1 x10E3/uL (ref 0.7–3.1)
Lymphs: 17 %
MCH: 30.5 pg (ref 26.6–33.0)
MCHC: 33.1 g/dL (ref 31.5–35.7)
MCV: 92 fL (ref 79–97)
Monocytes Absolute: 0.4 x10E3/uL (ref 0.1–0.9)
Monocytes: 8 %
Neutrophils Absolute: 4.3 x10E3/uL (ref 1.4–7.0)
Neutrophils: 73 %
Platelets: 235 x10E3/uL (ref 150–450)
RBC: 4.42 x10E6/uL (ref 3.77–5.28)
RDW: 14.1 % (ref 11.7–15.4)
WBC: 5.9 x10E3/uL (ref 3.4–10.8)

## 2023-10-22 LAB — COMPREHENSIVE METABOLIC PANEL WITH GFR
ALT: 16 IU/L (ref 0–32)
AST: 21 IU/L (ref 0–40)
Albumin: 4.3 g/dL (ref 3.8–4.8)
Alkaline Phosphatase: 84 IU/L (ref 44–121)
BUN/Creatinine Ratio: 19 (ref 12–28)
BUN: 15 mg/dL (ref 8–27)
Bilirubin Total: 0.3 mg/dL (ref 0.0–1.2)
CO2: 23 mmol/L (ref 20–29)
Calcium: 9.7 mg/dL (ref 8.7–10.3)
Chloride: 103 mmol/L (ref 96–106)
Creatinine, Ser: 0.8 mg/dL (ref 0.57–1.00)
Globulin, Total: 2.3 g/dL (ref 1.5–4.5)
Glucose: 98 mg/dL (ref 70–99)
Potassium: 4.9 mmol/L (ref 3.5–5.2)
Sodium: 142 mmol/L (ref 134–144)
Total Protein: 6.6 g/dL (ref 6.0–8.5)
eGFR: 77 mL/min/1.73 (ref >59)

## 2023-10-22 LAB — LIPID PANEL
Chol/HDL Ratio: 2.7 ratio (ref 0.0–4.4)
Cholesterol, Total: 216 mg/dL — ABNORMAL HIGH (ref 100–199)
HDL: 81 mg/dL (ref >39)
LDL Chol Calc (NIH): 115 mg/dL — ABNORMAL HIGH (ref 0–99)
Triglycerides: 117 mg/dL (ref 0–149)
VLDL Cholesterol Cal: 20 mg/dL (ref 5–40)

## 2023-10-22 LAB — T4, FREE: Free T4: 1.15 ng/dL (ref 0.82–1.77)

## 2023-10-22 LAB — TSH: TSH: 1.23 u[IU]/mL (ref 0.450–4.500)

## 2023-10-22 LAB — VITAMIN D 25 HYDROXY (VIT D DEFICIENCY, FRACTURES): Vit D, 25-Hydroxy: 41.5 ng/mL (ref 30.0–100.0)

## 2023-10-22 NOTE — Progress Notes (Signed)
 Lab results show no significant change from last year. All labs are normal except for slightly elevated cholesterol which is similar to last year as well.

## 2023-10-22 NOTE — Telephone Encounter (Signed)
-----   Message from Mardy Maxin sent at 10/22/2023  8:19 AM EDT ----- Lab results show no significant change from last year. All labs are normal except for slightly elevated cholesterol which is similar to last year as well.  ----- Message ----- From: Interface, [facility] Lab Results In Sent: 10/22/2023   5:36 AM EDT To: Mardy Maxin, NP

## 2023-10-22 NOTE — Telephone Encounter (Signed)
 Patient notified

## 2023-11-07 ENCOUNTER — Other Ambulatory Visit: Payer: Self-pay | Admitting: Nurse Practitioner

## 2023-11-08 ENCOUNTER — Other Ambulatory Visit: Payer: Self-pay | Admitting: Internal Medicine

## 2023-11-08 DIAGNOSIS — F411 Generalized anxiety disorder: Secondary | ICD-10-CM

## 2023-11-17 ENCOUNTER — Other Ambulatory Visit: Payer: Self-pay | Admitting: Nurse Practitioner

## 2023-11-17 DIAGNOSIS — E782 Mixed hyperlipidemia: Secondary | ICD-10-CM

## 2023-11-27 ENCOUNTER — Other Ambulatory Visit: Payer: Self-pay

## 2023-11-27 DIAGNOSIS — E782 Mixed hyperlipidemia: Secondary | ICD-10-CM

## 2023-11-27 MED ORDER — EZETIMIBE 10 MG PO TABS: 10.0000 mg | ORAL_TABLET | Freq: Every day | ORAL | 3 refills | Status: AC

## 2023-12-02 ENCOUNTER — Other Ambulatory Visit: Payer: Self-pay | Admitting: Nurse Practitioner

## 2023-12-02 DIAGNOSIS — Z1231 Encounter for screening mammogram for malignant neoplasm of breast: Secondary | ICD-10-CM

## 2023-12-11 ENCOUNTER — Ambulatory Visit
Admission: RE | Admit: 2023-12-11 | Discharge: 2023-12-11 | Disposition: A | Discharge status: Home / Self Care | Source: Ambulatory Visit | Attending: Nurse Practitioner | Admitting: Nurse Practitioner

## 2023-12-11 DIAGNOSIS — Z1231 Encounter for screening mammogram for malignant neoplasm of breast: Secondary | ICD-10-CM | POA: Diagnosis not present

## 2023-12-12 ENCOUNTER — Telehealth: Payer: Self-pay

## 2023-12-12 NOTE — Progress Notes (Signed)
   12/12/2023  Patient ID: Patricia Frazier, female   DOB: [birth year], 73 y.o.   MRN: [id number]  This patient is appearing on a report for being at risk of failing the adherence measure for identified medications this calendar year.   Medication Adherence Summary (STAR/HEDIS Monitoring): Adherence Category: cholesterol (statin)    Drug Name: Pravastatin  40 mg  Last Fill or Sold Date:11/25/2023 Days' Supply: 90      Notes: ? Adherence data pulled from pharmacy claims portal Dr. Annemarie. ? Reviewed barriers to adherence: none identified. ? Plan: Insurance report was not up to date. No action needed at this time.   Dorcas Solian, PharmD Clinical Pharmacist Cell: [phone]

## 2023-12-18 ENCOUNTER — Telehealth: Payer: Self-pay

## 2023-12-18 MED ORDER — DULOXETINE HCL 20 MG PO CPEP: 20.0000 mg | ORAL_CAPSULE | Freq: Every day | ORAL | 1 refills | Status: DC

## 2023-12-18 NOTE — Telephone Encounter (Signed)
Pt notified that we sent med

## 2024-01-11 ENCOUNTER — Other Ambulatory Visit: Payer: Self-pay | Admitting: Nurse Practitioner

## 2024-01-27 DIAGNOSIS — H43813 Vitreous degeneration, bilateral: Secondary | ICD-10-CM | POA: Diagnosis not present

## 2024-01-27 DIAGNOSIS — H401231 Low-tension glaucoma, bilateral, mild stage: Secondary | ICD-10-CM | POA: Diagnosis not present

## 2024-01-27 DIAGNOSIS — H2513 Age-related nuclear cataract, bilateral: Secondary | ICD-10-CM | POA: Diagnosis not present

## 2024-01-27 DIAGNOSIS — H5213 Myopia, bilateral: Secondary | ICD-10-CM | POA: Diagnosis not present

## 2024-01-27 DIAGNOSIS — H524 Presbyopia: Secondary | ICD-10-CM | POA: Diagnosis not present

## 2024-02-03 ENCOUNTER — Other Ambulatory Visit: Payer: Self-pay | Admitting: Nurse Practitioner

## 2024-02-03 ENCOUNTER — Encounter: Payer: Self-pay | Admitting: Nurse Practitioner

## 2024-02-03 ENCOUNTER — Ambulatory Visit: Admitting: Nurse Practitioner

## 2024-02-03 VITALS — BP 136/78 | HR 77 | Temp 96.5°F | Resp 16 | Ht 63.0 in | Wt 187.4 lb

## 2024-02-03 DIAGNOSIS — R0602 Shortness of breath: Secondary | ICD-10-CM | POA: Diagnosis not present

## 2024-02-03 DIAGNOSIS — I10 Essential (primary) hypertension: Secondary | ICD-10-CM | POA: Diagnosis not present

## 2024-02-03 DIAGNOSIS — Z9189 Other specified personal risk factors, not elsewhere classified: Secondary | ICD-10-CM

## 2024-02-03 DIAGNOSIS — I059 Rheumatic mitral valve disease, unspecified: Secondary | ICD-10-CM | POA: Diagnosis not present

## 2024-02-03 DIAGNOSIS — E66811 Obesity, class 1: Secondary | ICD-10-CM

## 2024-02-03 DIAGNOSIS — Z8249 Family history of ischemic heart disease and other diseases of the circulatory system: Secondary | ICD-10-CM | POA: Diagnosis not present

## 2024-02-03 DIAGNOSIS — E6609 Other obesity due to excess calories: Secondary | ICD-10-CM | POA: Diagnosis not present

## 2024-02-03 DIAGNOSIS — F411 Generalized anxiety disorder: Secondary | ICD-10-CM | POA: Diagnosis not present

## 2024-02-03 DIAGNOSIS — E782 Mixed hyperlipidemia: Secondary | ICD-10-CM | POA: Diagnosis not present

## 2024-02-03 DIAGNOSIS — Z6831 Body mass index (BMI) 31.0-31.9, adult: Secondary | ICD-10-CM | POA: Diagnosis not present

## 2024-02-03 MED ORDER — WEGOVY 0.5 MG/0.5ML ~~LOC~~ SOAJ: 0.5000 mg | SUBCUTANEOUS | 3 refills | Status: AC

## 2024-02-03 NOTE — Progress Notes (Signed)
 [facility] [address] [place], [place] [address]  Internal MEDICINE  Office Visit Note  Patient Name: Patricia Frazier  [id number]  [id number]  Date of Service: 02/03/2024  Chief Complaint  Patient presents with   Depression   Gastroesophageal Reflux   Hypertension   Follow-up    Review labs    HPI Patricia Frazier presents for a follow-up visit for weight gain, hypertension, mitral valve disorder and increased CVD risk.  Weight gain -- patient has gained 7 lbs this year. She cannot have phentermine  or similar medications due to her age. Wants to try wegovy or Zepbound.  Hypertension -- controlled with current medication. Mitral valve disorder -- last echocardiogram was done in 2020. She has a high risk of ASCVD. She also has a family history of heart disease. She reports increased SOB with exertion.   The 10-year ASCVD risk score (Arnett DK, et al., 2019) is: 23.7%   Values used to calculate the score:     Age: 73 years     Clincally relevant sex: Female     Is Non-Hispanic African American: No     Diabetic: No     Tobacco smoker: No     Systolic Blood Pressure: 136 mmHg     Is BP treated: Yes     HDL Cholesterol: 81 mg/dL     Total Cholesterol: 216 mg/dL  Fibrosis 4 Score = 8.31  Fib-4 interpretation is not validated for people under 35 or over 23 years of age. However, scores under 2.0 are generally considered low risk.    Current Medication: Outpatient Encounter Medications as of 02/03/2024  Medication Sig   semaglutide-weight management (WEGOVY) 0.5 MG/0.5ML SOAJ SQ injection Inject 0.5 mg into the skin once a week.   amLODipine  (NORVASC ) 10 MG tablet TAKE 1 TABLET BY MOUTH EVERY DAY   DULoxetine  (CYMBALTA ) 20 MG capsule TAKE 1 CAPSULE BY MOUTH EVERY DAY   ezetimibe  (ZETIA ) 10 MG tablet Take 1 tablet (10 mg total) by mouth daily.   latanoprost (XALATAN) 0.005 % ophthalmic solution Place 1 drop into both eyes at bedtime.   Multiple Vitamin  (MULTIVITAMIN) tablet Take 1 tablet by mouth daily. chewable   omeprazole  (PRILOSEC ) 20 MG capsule TAKE 1 CAPSULE BY MOUTH EVERY DAY   pravastatin  (PRAVACHOL ) 40 MG tablet TAKE 1 TABLET BY MOUTH EVERY DAY   No facility-administered encounter medications on file as of 02/03/2024.    Surgical History: Past Surgical History:  Procedure Laterality Date   BREAST CYST ASPIRATION Left yrs ago   LUMBAR LAMINECTOMY/DECOMPRESSION MICRODISCECTOMY Left 06/18/2018   Procedure: LUMBAR LAMINECTOMY/DECOMPRESSION MICRODISCECTOMY 1 LEVEL, L5-S1  LEFT;  Surgeon: Clois Fret, MD;  Location: [facility] ORS;  Service: Neurosurgery;  Laterality: Left;   STENT PLACE LEFT URETER ([facility] HX)     unsure  of side   TONSILLECTOMY     tubiligation      Medical History: Past Medical History:  Diagnosis Date   Anxiety    Back pain    Bulging lumbar disc    Chest pain    Depression    GERD (gastroesophageal reflux disease)    History of kidney stones    Hypertension    Migraine    Sleep apnea    has never used a cpap machine   Varicose veins of legs    Vertigo    Vision problems     Family History: Family History  Problem Relation Age of Onset   Hypertension Mother    Cancer  Father    Hypertension Father    Hyperlipidemia Father    Breast cancer Other 30       niece    Social History   Socioeconomic History   Marital status: Married    Spouse name: Patricia Frazier   Number of children: Not on file   Years of education: Not on file   Highest education level: Not on file  Occupational History   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    Comment: [employer]  Tobacco Use   Smoking status: Former    Current packs/day: 0.00    Types: Cigarettes    Quit date: 2000    Years since quitting: 25.8   Smokeless tobacco: Never  Vaping Use   Vaping status: Never Used  Substance and Sexual Activity   Alcohol use: Yes    Comment: socially   Drug use: No   Sexual activity: Not Currently  Other Topics Concern    Not on file  Social History Narrative   Not on file   Social Drivers of Health   Financial Resource Strain: Not on file  Food Insecurity: Not on file  Transportation Needs: Not on file  Physical Activity: Not on file  Stress: Not on file  Social Connections: Not on file  Intimate Partner Violence: Not on file      Review of Systems  Constitutional:  Positive for appetite change, fatigue and unexpected weight change.  HENT:  Negative for postnasal drip, rhinorrhea, sinus pressure, sinus pain and sneezing.   Respiratory: Negative.  Negative for cough, chest tightness, shortness of breath and wheezing.   Cardiovascular: Negative.  Negative for chest pain and palpitations.  Gastrointestinal: Negative.   Musculoskeletal:  Positive for arthralgias and back pain.  Neurological:  Negative for dizziness, light-headedness and headaches.    Vital Signs: BP 136/78   Pulse 77   Temp (!) 96.5 F (35.8 C)   Resp 16   Ht 5' 3 (1.6 m)   Wt 187 lb 6.4 oz (85 kg)   SpO2 95%   BMI 33.20 kg/m    Physical Exam Vitals reviewed.  Constitutional:      General: She is not in acute distress.    Appearance: Normal appearance. She is obese. She is not ill-appearing.  HENT:     Head: Normocephalic and atraumatic.  Eyes:     Pupils: Pupils are equal, round, and reactive to light.  Cardiovascular:     Rate and Rhythm: Normal rate and regular rhythm.  Neurological:     Mental Status: She is alert and oriented to person, place, and time.  Psychiatric:        Mood and Affect: Mood normal.        Behavior: Behavior normal.        Assessment/Plan: 1. Essential (primary) hypertension (Primary) Stable, continue amlodipine  as prescribed.  - semaglutide-weight management (WEGOVY) 0.5 MG/0.5ML SOAJ SQ injection; Inject 0.5 mg into the skin once a week.  Dispense: 2 mL; Refill: 3  2. Mitral valve disorder Echocardiogram ordered, follow up for results - ECHOCARDIOGRAM COMPLETE; Future  3.  SOB (shortness of breath) on exertion Echocardiogram ordered, follow up for results  - ECHOCARDIOGRAM COMPLETE; Future  4. Mixed hyperlipidemia Continue pravastatin  as prescribed. Start wegovy to aid in weight loss which will help improve her cholesterol levels as well.  - semaglutide-weight management (WEGOVY) 0.5 MG/0.5ML SOAJ SQ injection; Inject 0.5 mg into the skin once a week.  Dispense: 2 mL; Refill: 3  5.  Class 1 obesity due to excess calories with serious comorbidity and body mass index (BMI) of 31.0 to 31.9 in adult [facility] prescribed. Samples given to patient.  - semaglutide-weight management (WEGOVY) 0.5 MG/0.5ML SOAJ SQ injection; Inject 0.5 mg into the skin once a week.  Dispense: 2 mL; Refill: 3  6. At high risk for cardiovascular disease Wegovy prescribed, samples given to patient. Try to get wegovy approved by insurance.  - semaglutide-weight management (WEGOVY) 0.5 MG/0.5ML SOAJ SQ injection; Inject 0.5 mg into the skin once a week.  Dispense: 2 mL; Refill: 3  7. Family history of heart disease Will try to get wegovy approved  - semaglutide-weight management (WEGOVY) 0.5 MG/0.5ML SOAJ SQ injection; Inject 0.5 mg into the skin once a week.  Dispense: 2 mL; Refill: 3  8. GAD (generalized anxiety disorder) Continue duloxetine  as prescribed    General Counseling: lakeena downie understanding of the findings of todays visit and agrees with plan of treatment. I have discussed any further diagnostic evaluation that may be needed or ordered today. We also reviewed her medications today. she has been encouraged to call the office with any questions or concerns that should arise related to todays visit.    Orders Placed This Encounter  Procedures   ECHOCARDIOGRAM COMPLETE    Meds ordered this encounter  Medications   semaglutide-weight management (WEGOVY) 0.5 MG/0.5ML SOAJ SQ injection    Sig: Inject 0.5 mg into the skin once a week.    Dispense:  2 mL    Refill:  3     Fill new script today. Patient has high risk of ASCVD and family history of heart disease    Return in about 6 weeks (around 03/19/2024) for F/U, Latish Toutant PCP echo results and wegovy.   Total time spent:30 Minutes Time spent includes review of chart, medications, test results, and follow up plan with the patient.   [place] Controlled Substance Database was reviewed by me.  This patient was seen by Mardy Maxin, FNP-C in collaboration with Dr. Sigrid Bathe as a part of collaborative care agreement.   Lashuna Tamashiro R. Maxin, MSN, FNP-C Internal medicine

## 2024-02-04 ENCOUNTER — Telehealth: Payer: Self-pay | Admitting: Nurse Practitioner

## 2024-02-04 NOTE — Telephone Encounter (Signed)
 Notified patient of echo appointment date, arrival time, location-[place]

## 2024-02-11 ENCOUNTER — Other Ambulatory Visit: Payer: Self-pay | Admitting: Nurse Practitioner

## 2024-02-11 DIAGNOSIS — I10 Essential (primary) hypertension: Secondary | ICD-10-CM

## 2024-02-13 ENCOUNTER — Ambulatory Visit

## 2024-02-24 ENCOUNTER — Ambulatory Visit
Admission: RE | Admit: 2024-02-24 | Discharge: 2024-02-24 | Disposition: A | Discharge status: Home / Self Care | Source: Ambulatory Visit | Attending: Nurse Practitioner | Admitting: Nurse Practitioner

## 2024-02-24 ENCOUNTER — Encounter: Payer: Self-pay | Admitting: Nurse Practitioner

## 2024-02-24 DIAGNOSIS — R0600 Dyspnea, unspecified: Secondary | ICD-10-CM | POA: Diagnosis not present

## 2024-02-24 DIAGNOSIS — R0602 Shortness of breath: Secondary | ICD-10-CM | POA: Insufficient documentation

## 2024-02-24 DIAGNOSIS — I059 Rheumatic mitral valve disease, unspecified: Secondary | ICD-10-CM | POA: Insufficient documentation

## 2024-02-24 LAB — ECHOCARDIOGRAM COMPLETE
AR max vel: 2.29 cm2
AV Area VTI: 2.15 cm2
AV Area mean vel: 2.17 cm2
AV Mean grad: 4 mmHg
AV Peak grad: 8.5 mmHg
Ao pk vel: 1.46 m/s
Area-P 1/2: 3.53 cm2
MV VTI: 2.29 cm2
S' Lateral: 2.6 cm
Single Plane A4C EF: 55.9 %

## 2024-03-18 ENCOUNTER — Ambulatory Visit (INDEPENDENT_AMBULATORY_CARE_PROVIDER_SITE_OTHER): Admitting: Nurse Practitioner

## 2024-03-18 ENCOUNTER — Encounter: Payer: Self-pay | Admitting: Nurse Practitioner

## 2024-03-18 VITALS — BP 126/84 | HR 67 | Temp 97.5°F | Resp 16 | Ht 63.0 in | Wt 179.8 lb

## 2024-03-18 DIAGNOSIS — I5189 Other ill-defined heart diseases: Secondary | ICD-10-CM | POA: Insufficient documentation

## 2024-03-18 DIAGNOSIS — Z8249 Family history of ischemic heart disease and other diseases of the circulatory system: Secondary | ICD-10-CM

## 2024-03-18 DIAGNOSIS — I10 Essential (primary) hypertension: Secondary | ICD-10-CM | POA: Diagnosis not present

## 2024-03-18 NOTE — Progress Notes (Signed)
 [facility] [address] [place], [place] [address]  Internal MEDICINE  Office Visit Note  Patient Name: Patricia Frazier  [id number]  [id number]  Date of Service: 03/18/2024  Chief Complaint  Patient presents with   Depression   Gastroesophageal Reflux   Hypertension   Follow-up    Review Echo    HPI Patricia Frazier presents for a follow-up visit for echocardiogram results  Echo results:  Borderline LVH Grade 1 diastolic dysfunction All valves were normal with no regurgitation.   Wants to go back on wegovy , not covered right but her insurance will start to cover it sometime next year.   Her AWV is scheduled for end of April next year.    Current Medication: Outpatient Encounter Medications as of 03/18/2024  Medication Sig   amLODipine  (NORVASC ) 10 MG tablet TAKE 1 TABLET BY MOUTH EVERY DAY   DULoxetine  (CYMBALTA ) 20 MG capsule TAKE 1 CAPSULE BY MOUTH EVERY DAY   ezetimibe  (ZETIA ) 10 MG tablet Take 1 tablet (10 mg total) by mouth daily.   latanoprost (XALATAN) 0.005 % ophthalmic solution Place 1 drop into both eyes at bedtime.   Multiple Vitamin (MULTIVITAMIN) tablet Take 1 tablet by mouth daily. chewable   omeprazole  (PRILOSEC ) 20 MG capsule TAKE 1 CAPSULE BY MOUTH EVERY DAY   pravastatin  (PRAVACHOL ) 40 MG tablet TAKE 1 TABLET BY MOUTH EVERY DAY   semaglutide -weight management (WEGOVY ) 0.5 MG/0.5ML SOAJ SQ injection Inject 0.5 mg into the skin once a week.   No facility-administered encounter medications on file as of 03/18/2024.    Surgical History: Past Surgical History:  Procedure Laterality Date   BREAST CYST ASPIRATION Left yrs ago   LUMBAR LAMINECTOMY/DECOMPRESSION MICRODISCECTOMY Left 06/18/2018   Procedure: LUMBAR LAMINECTOMY/DECOMPRESSION MICRODISCECTOMY 1 LEVEL, L5-S1  LEFT;  Surgeon: Clois Fret, MD;  Location: [facility] ORS;  Service: Neurosurgery;  Laterality: Left;   STENT PLACE LEFT URETER ([facility] HX)     unsure  of side   TONSILLECTOMY      tubiligation      Medical History: Past Medical History:  Diagnosis Date   Anxiety    Back pain    Bulging lumbar disc    Chest pain    Depression    GERD (gastroesophageal reflux disease)    History of kidney stones    Hypertension    Migraine    Sleep apnea    has never used a cpap machine   Varicose veins of legs    Vertigo    Vision problems     Family History: Family History  Problem Relation Age of Onset   Hypertension Mother    Cancer Father    Hypertension Father    Hyperlipidemia Father    Breast cancer Other 58       niece    Social History   Socioeconomic History   Marital status: Married    Spouse name: Patricia Frazier   Number of children: Not on file   Years of education: Not on file   Highest education level: Not on file  Occupational History   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    Comment: [employer]  Tobacco Use   Smoking status: Former    Current packs/day: 0.00    Types: Cigarettes    Quit date: 2000    Years since quitting: 25.9   Smokeless tobacco: Never  Vaping Use   Vaping status: Never Used  Substance and Sexual Activity   Alcohol use: Yes    Comment: socially  Drug use: No   Sexual activity: Not Currently  Other Topics Concern   Not on file  Social History Narrative   Not on file   Social Drivers of Health   Financial Resource Strain: Not on file  Food Insecurity: Not on file  Transportation Needs: Not on file  Physical Activity: Not on file  Stress: Not on file  Social Connections: Not on file  Intimate Partner Violence: Not on file      Review of Systems  Constitutional:  Positive for appetite change, fatigue and unexpected weight change.  HENT:  Negative for postnasal drip, rhinorrhea, sinus pressure, sinus pain and sneezing.   Respiratory: Negative.  Negative for cough, chest tightness, shortness of breath and wheezing.   Cardiovascular: Negative.  Negative for chest pain and palpitations.  Gastrointestinal:  Negative.   Musculoskeletal:  Positive for arthralgias and back pain.  Neurological:  Negative for dizziness, light-headedness and headaches.    Vital Signs: BP 126/84   Pulse 67   Temp (!) 97.5 F (36.4 C)   Resp 16   Ht 5' 3 (1.6 m)   Wt 179 lb 12.8 oz (81.6 kg)   SpO2 98%   BMI 31.85 kg/m    Physical Exam Vitals reviewed.  Constitutional:      General: She is not in acute distress.    Appearance: Normal appearance. She is obese. She is not ill-appearing.  HENT:     Head: Normocephalic and atraumatic.  Eyes:     Pupils: Pupils are equal, round, and reactive to light.  Cardiovascular:     Rate and Rhythm: Normal rate and regular rhythm.  Neurological:     Mental Status: She is alert and oriented to person, place, and time.  Psychiatric:        Mood and Affect: Mood normal.        Behavior: Behavior normal.        Assessment/Plan: 1. Grade I diastolic dysfunction (Primary) Noted on echo, repeat echocardiogram in 3 years for monitoring   2. Essential (primary) hypertension Stable, Continue medications as prescribed.   3. Family history of heart disease Repeat echocardiogram in 3 years for monitoring.    General Counseling: Patricia Frazier understanding of the findings of todays visit and agrees with plan of treatment. I have discussed any further diagnostic evaluation that may be needed or ordered today. We also reviewed her medications today. she has been encouraged to call the office with any questions or concerns that should arise related to todays visit.    No orders of the defined types were placed in this encounter.   No orders of the defined types were placed in this encounter.   Return for previously scheduled, AWV, Patricia Frazier PCP in april and otherwise as needed. .   Total time spent:30 Minutes Time spent includes review of chart, medications, test results, and follow up plan with the patient.   [place] Controlled Substance Database was reviewed  by me.  This patient was seen by Mardy Maxin, FNP-C in collaboration with Dr. Sigrid Bathe as a part of collaborative care agreement.   Izac Faulkenberry R. Maxin, MSN, FNP-C Internal medicine

## 2024-04-09 ENCOUNTER — Other Ambulatory Visit: Payer: Self-pay | Admitting: Nurse Practitioner

## 2024-05-06 ENCOUNTER — Other Ambulatory Visit: Payer: Self-pay | Admitting: Nurse Practitioner

## 2024-05-08 ENCOUNTER — Other Ambulatory Visit: Payer: Self-pay | Admitting: Nurse Practitioner

## 2024-05-08 DIAGNOSIS — Z8249 Family history of ischemic heart disease and other diseases of the circulatory system: Secondary | ICD-10-CM

## 2024-05-08 DIAGNOSIS — Z9189 Other specified personal risk factors, not elsewhere classified: Secondary | ICD-10-CM

## 2024-05-08 DIAGNOSIS — I10 Essential (primary) hypertension: Secondary | ICD-10-CM

## 2024-05-08 DIAGNOSIS — E66811 Obesity, class 1: Secondary | ICD-10-CM

## 2024-05-08 DIAGNOSIS — E782 Mixed hyperlipidemia: Secondary | ICD-10-CM

## 2024-08-05 ENCOUNTER — Ambulatory Visit: Admitting: Nurse Practitioner
